# Patient Record
Sex: Female | Born: 1965 | Race: White | Hispanic: No | Smoking: Former smoker
Health system: Southern US, Community
[De-identification: ages and names within clinical notes are randomized; demographics above are authoritative.]

## PROBLEM LIST (undated history)

## (undated) DIAGNOSIS — F32A Depression, unspecified: Secondary | ICD-10-CM

## (undated) DIAGNOSIS — F329 Major depressive disorder, single episode, unspecified: Secondary | ICD-10-CM

## (undated) DIAGNOSIS — F419 Anxiety disorder, unspecified: Secondary | ICD-10-CM

## (undated) HISTORY — DX: Major depressive disorder, single episode, unspecified: F32.9

## (undated) HISTORY — DX: Depression, unspecified: F32.A

## (undated) HISTORY — DX: Anxiety disorder, unspecified: F41.9

## (undated) HISTORY — PX: AUGMENTATION MAMMAPLASTY: SUR837

---

## 1997-09-16 ENCOUNTER — Ambulatory Visit (HOSPITAL_BASED_OUTPATIENT_CLINIC_OR_DEPARTMENT_OTHER): Admission: RE | Admit: 1997-09-16 | Discharge: 1997-09-16 | Payer: Self-pay

## 1998-03-08 ENCOUNTER — Other Ambulatory Visit: Admission: RE | Admit: 1998-03-08 | Discharge: 1998-03-08 | Payer: Self-pay | Admitting: *Deleted

## 1999-05-08 ENCOUNTER — Other Ambulatory Visit: Admission: RE | Admit: 1999-05-08 | Discharge: 1999-05-08 | Payer: Self-pay | Admitting: *Deleted

## 2000-07-28 ENCOUNTER — Other Ambulatory Visit: Admission: RE | Admit: 2000-07-28 | Discharge: 2000-07-28 | Payer: Self-pay | Admitting: *Deleted

## 2001-12-17 ENCOUNTER — Other Ambulatory Visit: Admission: RE | Admit: 2001-12-17 | Discharge: 2001-12-17 | Payer: Self-pay | Admitting: Obstetrics and Gynecology

## 2003-05-11 ENCOUNTER — Other Ambulatory Visit: Admission: RE | Admit: 2003-05-11 | Discharge: 2003-05-11 | Payer: Self-pay | Admitting: Obstetrics and Gynecology

## 2004-05-30 ENCOUNTER — Other Ambulatory Visit: Admission: RE | Admit: 2004-05-30 | Discharge: 2004-05-30 | Payer: Self-pay | Admitting: Obstetrics and Gynecology

## 2004-06-18 ENCOUNTER — Encounter: Admission: RE | Admit: 2004-06-18 | Discharge: 2004-06-18 | Payer: Self-pay | Admitting: Obstetrics & Gynecology

## 2005-06-27 ENCOUNTER — Other Ambulatory Visit: Admission: RE | Admit: 2005-06-27 | Discharge: 2005-06-27 | Payer: Self-pay | Admitting: Obstetrics & Gynecology

## 2008-08-25 ENCOUNTER — Encounter: Admission: RE | Admit: 2008-08-25 | Discharge: 2008-08-25 | Payer: Self-pay | Admitting: Obstetrics & Gynecology

## 2010-04-15 ENCOUNTER — Encounter: Payer: Self-pay | Admitting: Obstetrics & Gynecology

## 2010-04-16 ENCOUNTER — Encounter: Payer: Self-pay | Admitting: Obstetrics & Gynecology

## 2010-06-04 ENCOUNTER — Ambulatory Visit (INDEPENDENT_AMBULATORY_CARE_PROVIDER_SITE_OTHER): Payer: Self-pay | Admitting: Sports Medicine

## 2010-06-04 ENCOUNTER — Encounter: Payer: Self-pay | Admitting: Sports Medicine

## 2010-06-04 DIAGNOSIS — M722 Plantar fascial fibromatosis: Secondary | ICD-10-CM

## 2010-06-04 DIAGNOSIS — M79609 Pain in unspecified limb: Secondary | ICD-10-CM | POA: Insufficient documentation

## 2010-06-12 NOTE — Assessment & Plan Note (Signed)
Summary: NP,RT FOOT/TOES SWELLING,MC   Vital Signs:  Patient profile:   45 year old female Height:      68 inches Weight:      130 pounds BMI:     19.84 BP sitting:   113 / 74  Vitals Entered By: Lillia Pauls CMA (June 04, 2010 9:38 AM)  CC:  R toe pain & L plantar.  History of Present Illness: [name] is a 44 yo who presents to clinic with a CC of R 3 & 4th toe pain and left plantar fasciitis.  The patient states that her pain began after a long walks on the beach barefoot over labor day weekend. In September she began experienced bilatetral PF symptoms and was seen by an orthopedic surgeon.  She began a stretching routine and wore some type of "pain pads" which helped her R plantar fasciitis resolve.  Unfortunately, the left side has not improved and she is still experiencing pain that is usually worse when she wakes up in the AM and has caused her to alter her gain.  After her PF started, the patient altered her gait which resulted in stubbing her right toes while ambulating. Ever since this event in September the patient has expereineced significant toe pain and swelling of her foot, especially the 3, 4, and 5th metatarsal. She was in a walking boot for 4 weeks at one time, however it did not help.  She received an XRay by her orthopedic surgeon & podiatrist yet neither showed signs of a fracture.   Physical Exam  General:  alert, healthy-appearing, and cooperative to examination.   Msk:  R foot no TTP at insertion of plantar fascia swollen 3, 4, and 5 metatarsals normal strength and ROM pes cavus  L foot TTP of insertion of plantar fascia no Achilles swelling or tenderness normal ROM and stregth pes cavus Additional Exam:  [place]: LPF 0.61 cm & calcification present, stone bruise located on calcaneus R: 4th MTP: effusion and interarticular fx with neovessels, 3rd MTP: interarticular fx with neovessels RPF 0.34 cm   Impression & Recommendations:  Problem # 1:   FASCIITIS, PLANTAR (ICD-728.71)  Left PF US showed 0.61 cm and calcifications presetn in LPF Cannot perform exercises due to bilateral foot injury Advised to massage plantar fascia 30 sec x 3 sevral times a day & ice bath Arch strap for support and return ASAP for custom fit orthotics (pes cavus)  Orders: [place] LIMITED ([id number])  Problem # 2:  FOOT PAIN (ICD-729.5) R interarticular fractures of 3rd and 4th MTP joint Advised to wear strap close to MTP joints while ambulating during the day to minimize movements Ice bath for R foot apprx 5-10 mins F/U appt ASAP for custom fit orthotics for pes cavus  Orders: Foot Orthosis ( Arch Strap/Heel Cup) [phone]) Sports Insoles [phone]) [place] LIMITED [phone])  Patient Instructions: 1)  Please make follow up appointment ASAP to be fitted for custom orthotics (please bring tennis shoes on this visit). 2)  Stretch plantar fascia and massage for 30 sec x 3 several times a day. 3)  Ice bath 5-10 mins for left plantar fasciitis and for swollen right toes. 4)  Wear support straps during the day and when on feet for long periods of time. Wear right strap closer to toes to prevent uneccessary movement. Wear left strap on arch for support.   Orders Added: 1)  Foot Orthosis ( Arch Strap/Heel Cup) [G9562] 2)  Sports Insoles [L3510] 3)  New Patient Level  III [id number] 4)  [place] LIMITED [[id number]]

## 2010-06-21 ENCOUNTER — Encounter: Payer: Self-pay | Admitting: Sports Medicine

## 2010-06-21 ENCOUNTER — Ambulatory Visit (INDEPENDENT_AMBULATORY_CARE_PROVIDER_SITE_OTHER): Payer: BC Managed Care – PPO | Admitting: Sports Medicine

## 2010-06-21 DIAGNOSIS — M722 Plantar fascial fibromatosis: Secondary | ICD-10-CM

## 2010-06-21 DIAGNOSIS — M79673 Pain in unspecified foot: Secondary | ICD-10-CM | POA: Insufficient documentation

## 2010-06-21 DIAGNOSIS — M79609 Pain in unspecified limb: Secondary | ICD-10-CM

## 2010-06-21 NOTE — Progress Notes (Signed)
  Subjective:    Patient ID: Lori Russell, female    DOB: 01-Apr-1965, 45 y.o.   MRN: [id number]  HPI 45 year old female to office today for custom orthotics. Patient has been suffering from bilateral plantar fasciitis for several months and has had no improvement with other conservative therapy. Symptoms are worse in the morning upon waking. She is doing home exercise program as directed. He is wearing arch wraps regularly. Also noted to have swelling in her third and fourth toes of the right foot, last visit [facility] ultrasound revealed signs of old fracture in this area which is likely contributing to her symptoms. Denies any numbness or tingling.   Review of Systems Per history of present illness BP 104/60     Objective:   Physical Exam GENERAL: Alert and oriented x3, no acute distress pleasant [facility]: - Foot/ankles: Ankles with full range of motion without pain, weakness, or instability.  Noted to have pes cavus bilaterally. She is tender to palpation along the insertion of plantar fascia bilaterally left greater than right. She has no Achilles tenderness or swelling bilaterally. Noted to have swollen 3rd & 4th toes on right foot with associated tenderness at the MTP and PIP joints, no surrounding bruising or erythema. - Gait: No leg length difference, walking with antalgic gait.       Assessment & Plan:   Problem List as of 06/21/2010        Rebecka Apley   Last Assessment & Plan Note   06/21/2010 Clinical Support Signed 06/21/2010  5:38 PM by Claris Che, MD    Fitted with custom orthotics in office today Patient was fitted for a : standard, cushioned, semi-rigid orthotic. The orthotic was heated, placed on the orthotic stand. The patient was positioned in subtalar neutral position and 10 degrees of ankle dorsiflexion in a weight bearing stance on the heated orthotic blank After completion of molding, a stable base was applied to the orthotic blank. The blank was ground  to a stable position for weight bearing. Size: 9  Base: Thin black orthotic blank for dress shoes Posting: blue EVA Additional posting: Neuroma pad placed on right orthotic Orthotics comfortable patient in the office today Should wear these in her work shoes, may remove in place in her walking shoes. - Should continue plantar fascia exercises - Continue to wear arch straps - Followup 6-8 weeks for reevaluation, may return sooner if needed       Pain in limb   Foot pain   Last Assessment & Plan Note   06/21/2010 Clinical Support Signed 06/21/2010  5:41 PM by Claris Che, MD    - R interarticular fractures of 3rd and 4th MTP joint noted on [facility] ultrasound last visit - Fitted with custom orthotics in office today with neuroma pad placed on right insert which should help with this - Continue to wear arch trap to minimize movement at this joint - Continue to ice as needed - Followup 6-8 weeks for reevaluation.

## 2010-06-21 NOTE — Assessment & Plan Note (Signed)
-   R interarticular fractures of 3rd and 4th MTP joint noted on [facility] ultrasound last visit - Fitted with custom orthotics in office today with neuroma pad placed on right insert which should help with this - Continue to wear arch trap to minimize movement at this joint - Continue to ice as needed - Followup 6-8 weeks for reevaluation.

## 2010-06-21 NOTE — Assessment & Plan Note (Signed)
Fitted with custom orthotics in office today Patient was fitted for a : standard, cushioned, semi-rigid orthotic. The orthotic was heated, placed on the orthotic stand. The patient was positioned in subtalar neutral position and 10 degrees of ankle dorsiflexion in a weight bearing stance on the heated orthotic blank After completion of molding, a stable base was applied to the orthotic blank. The blank was ground to a stable position for weight bearing. Size: 9  Base: Thin black orthotic blank for dress shoes Posting: blue EVA Additional posting: Neuroma pad placed on right orthotic Orthotics comfortable patient in the office today Should wear these in her work shoes, may remove in place in her walking shoes. - Should continue plantar fascia exercises - Continue to wear arch straps - Followup 6-8 weeks for reevaluation, may return sooner if needed

## 2011-08-08 ENCOUNTER — Encounter (HOSPITAL_COMMUNITY): Payer: Self-pay | Admitting: Licensed Clinical Social Worker

## 2011-08-08 ENCOUNTER — Ambulatory Visit (INDEPENDENT_AMBULATORY_CARE_PROVIDER_SITE_OTHER): Payer: BC Managed Care – PPO | Admitting: Licensed Clinical Social Worker

## 2011-08-08 DIAGNOSIS — F332 Major depressive disorder, recurrent severe without psychotic features: Secondary | ICD-10-CM

## 2011-08-08 DIAGNOSIS — F411 Generalized anxiety disorder: Secondary | ICD-10-CM

## 2011-08-08 NOTE — Progress Notes (Signed)
Patient ID: Lori Russell, female   DOB: 02/18/1966, 46 y.o.   MRN: [id number]  Patient:   Lori Russell   DOB:   [birth year]  MR Number:  [id number]  Location:  [facility] [address] [place] [address] Dept: [phone]           Date of Service:   08/08/2011  Start Time:   10:30am End Time:   11:30am  Provider/Observer:  Geanie Berlin LCSW       Billing Code/Service: [phone]  Chief Complaint:     Chief Complaint  Patient presents with  . Depression    weight loss 15 pounds,  increassed sleep   . Anxiety  . Stress    Reason for Service:  Patient is self referred for the treatment of depression.   Current Status:  Patient presents with depressed mood and anxious affect. She reports feeling severely depressed for the past 8 weeks and is concerned that she is not getting better. She endorses sadness all day, anhedonia, poor or absent appetite with weight loss of 15 pounds, increased sleep, obsessive thoughts and behaviors, increased isolation, poor ADL's on the weekends, crying spells, generalized anxiety and feelings of dread. She has a history of exercising compulsively but abruptly stopped when she became depressed. She has a vitamin ritual of taking 35 vitamins a day and endorses obsessive thoughts about her health. She denies any checking or counting behaviors. 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 She does have some problems with focus at work. She finds parenting stressful and is concerned that her daughter will be coming home from college this summer and she will have to interact with her. She is tearful and guarded during this assessment and often chooses to avoid answering questions involving detail. She does report that she ended a long term relationship with her boyfriend of 10 years in January, but will not disclose any  further information about this. Her thinking is clear and goal directed. She drinks two to three glasses of wine each night, which she reports is normal for her. She does not think she is drinking too much. She denies any AH, VH or paranoia. She denies any suicidal or homicida ideation, intent or plan.   Reliability of Information: Very good  Behavioral Observation: Lori Russell  presents as a 46 y.o.-year-old  Caucasian Female who appeared her stated age. her dress was Appropriate and she was Well Groomed and her manners were Appropriate to the situation.  There were not any physical disabilities noted.  she displayed an appropriate level of cooperation and motivation.    Interactions:    Active   Attention:   within normal limits  Memory:   within normal limits  Visuo-spatial:   within normal limits  Speech (Volume):  normal  Speech:   normal pitch and normal volume  Thought Process:  Coherent and Relevant  Though Content:  WNL  Orientation:   person, place, time/date and situation  Judgment:   Good  Planning:   Good  Affect:    Anxious and Depressed  Mood:    Anxious and Depressed  Insight:   Fair  Intelligence:   normal  Marital Status/Living: Divorced for 12 years. Has two children. One daughter is away at college and her son lives with her. Her daughter will be returning   Current Employment: Works as  a Equities trader.   Past Employment:    Substance Use:  There are suspicions of alcohol abuse reported by the patient.    Education:   College  Medical History:   Past Medical History  Diagnosis Date  . Depression   . Asthma         No outpatient encounter prescriptions on file as of 08/08/2011.          Sexual History:   History  Sexual Activity  . Sexually Active: Not Currently    Abuse/Trauma History: None reported   Psychiatric History:  Past outpatient treatment for therapy. Had a trial of Paxil years ago. Has has at least two other  major depressive episodes. Typically does not follow through on therapy because she is too guarded. There are no o inpatient hospitalizations or suicide attempts.    Family Med/Psych History: History reviewed. No pertinent family history.  Risk of Suicide/Violence: virtually non-existent   Impression/DX:  MDD severe, recurrent without psychotic features; generalized anxiety disorder   Disposition/Plan:  Weekly treatment to target depression and anxiety. Recommend patient see Dr. Donell Beers for medication evaluation. She agrees to this plan of treatment.   Diagnosis:    Axis I:  MDD severe, recurrent without psychotic features; generalized anxiety disorder        Axis II: No diagnosis       Axis III:  none      Axis IV:  problems related to social environment and problems with primary support group          Axis V:  51-60 moderate symptoms

## 2011-08-26 ENCOUNTER — Encounter (HOSPITAL_COMMUNITY): Payer: Self-pay | Admitting: Licensed Clinical Social Worker

## 2011-08-26 ENCOUNTER — Ambulatory Visit (HOSPITAL_COMMUNITY): Payer: Self-pay | Admitting: Licensed Clinical Social Worker

## 2011-08-26 NOTE — Progress Notes (Signed)
Patient ID: Lori Russell, female   DOB: 10/13/1968, 46 y.o.   MRN: [id number] Patient was a no show/no call for appointment. Letter sent.

## 2011-09-02 ENCOUNTER — Ambulatory Visit (HOSPITAL_COMMUNITY): Payer: Self-pay | Admitting: Licensed Clinical Social Worker

## 2011-09-02 ENCOUNTER — Encounter (HOSPITAL_COMMUNITY): Payer: Self-pay | Admitting: Licensed Clinical Social Worker

## 2011-09-02 NOTE — Progress Notes (Signed)
Patient ID: Lori Russell, female   DOB: 10/13/1968, 46 y.o.   MRN: [id number] Patient was a no show/no call for appointment today.

## 2011-09-09 ENCOUNTER — Ambulatory Visit (HOSPITAL_COMMUNITY): Payer: Self-pay | Admitting: Licensed Clinical Social Worker

## 2011-09-09 ENCOUNTER — Encounter (HOSPITAL_COMMUNITY): Payer: Self-pay

## 2011-09-09 ENCOUNTER — Telehealth (HOSPITAL_COMMUNITY): Payer: Self-pay

## 2011-09-09 NOTE — Telephone Encounter (Signed)
1:39pm 09/09/11 called pt due to email from provider - th ept has missed her last several appts - unable to leave msg due to mailbox is full./sh  Sending letter./sh

## 2011-09-16 ENCOUNTER — Ambulatory Visit (HOSPITAL_COMMUNITY): Payer: Self-pay | Admitting: Licensed Clinical Social Worker

## 2011-09-18 ENCOUNTER — Ambulatory Visit (HOSPITAL_COMMUNITY): Payer: Self-pay | Admitting: Psychiatry

## 2018-12-28 ENCOUNTER — Other Ambulatory Visit: Payer: Self-pay

## 2018-12-28 DIAGNOSIS — Z20822 Contact with and (suspected) exposure to covid-19: Secondary | ICD-10-CM

## 2018-12-30 LAB — NOVEL CORONAVIRUS, NAA: SARS-CoV-2, NAA: NOT DETECTED

## 2019-05-17 ENCOUNTER — Other Ambulatory Visit: Payer: Self-pay | Admitting: Physician Assistant

## 2019-05-17 ENCOUNTER — Other Ambulatory Visit (HOSPITAL_COMMUNITY)
Admission: RE | Admit: 2019-05-17 | Discharge: 2019-05-17 | Disposition: A | Discharge status: Home / Self Care | Payer: 59 | Source: Ambulatory Visit | Attending: Physician Assistant | Admitting: Physician Assistant

## 2019-05-17 DIAGNOSIS — Z124 Encounter for screening for malignant neoplasm of cervix: Secondary | ICD-10-CM | POA: Diagnosis not present

## 2019-05-17 DIAGNOSIS — Z0000 Encounter for general adult medical examination without abnormal findings: Secondary | ICD-10-CM | POA: Diagnosis not present

## 2019-05-17 DIAGNOSIS — Z1211 Encounter for screening for malignant neoplasm of colon: Secondary | ICD-10-CM | POA: Diagnosis not present

## 2019-05-17 DIAGNOSIS — Z23 Encounter for immunization: Secondary | ICD-10-CM | POA: Diagnosis not present

## 2019-05-17 DIAGNOSIS — E785 Hyperlipidemia, unspecified: Secondary | ICD-10-CM | POA: Diagnosis not present

## 2019-05-18 DIAGNOSIS — D485 Neoplasm of uncertain behavior of skin: Secondary | ICD-10-CM | POA: Diagnosis not present

## 2019-05-18 LAB — CYTOLOGY - PAP
Comment: NEGATIVE
Diagnosis: NEGATIVE
High risk HPV: NEGATIVE

## 2019-05-19 ENCOUNTER — Other Ambulatory Visit: Payer: Self-pay | Admitting: Physician Assistant

## 2019-05-19 DIAGNOSIS — Z1231 Encounter for screening mammogram for malignant neoplasm of breast: Secondary | ICD-10-CM

## 2019-05-25 DIAGNOSIS — Z23 Encounter for immunization: Secondary | ICD-10-CM | POA: Diagnosis not present

## 2019-05-25 DIAGNOSIS — L089 Local infection of the skin and subcutaneous tissue, unspecified: Secondary | ICD-10-CM | POA: Diagnosis not present

## 2019-05-25 DIAGNOSIS — Z5189 Encounter for other specified aftercare: Secondary | ICD-10-CM | POA: Diagnosis not present

## 2019-05-31 DIAGNOSIS — Z23 Encounter for immunization: Secondary | ICD-10-CM | POA: Diagnosis not present

## 2019-06-24 ENCOUNTER — Other Ambulatory Visit: Payer: Self-pay

## 2019-06-24 ENCOUNTER — Other Ambulatory Visit: Payer: Self-pay | Admitting: Physician Assistant

## 2019-06-24 ENCOUNTER — Ambulatory Visit
Admission: RE | Admit: 2019-06-24 | Discharge: 2019-06-24 | Disposition: A | Discharge status: Home / Self Care | Payer: 59 | Source: Ambulatory Visit | Attending: Physician Assistant | Admitting: Physician Assistant

## 2019-06-24 DIAGNOSIS — Z1231 Encounter for screening mammogram for malignant neoplasm of breast: Secondary | ICD-10-CM

## 2019-06-28 DIAGNOSIS — Z23 Encounter for immunization: Secondary | ICD-10-CM | POA: Diagnosis not present

## 2019-07-28 DIAGNOSIS — Z23 Encounter for immunization: Secondary | ICD-10-CM | POA: Diagnosis not present

## 2019-08-11 DIAGNOSIS — Z1159 Encounter for screening for other viral diseases: Secondary | ICD-10-CM | POA: Diagnosis not present

## 2019-08-16 DIAGNOSIS — D123 Benign neoplasm of transverse colon: Secondary | ICD-10-CM | POA: Diagnosis not present

## 2019-08-16 DIAGNOSIS — Z1211 Encounter for screening for malignant neoplasm of colon: Secondary | ICD-10-CM | POA: Diagnosis not present

## 2019-08-18 DIAGNOSIS — D123 Benign neoplasm of transverse colon: Secondary | ICD-10-CM | POA: Diagnosis not present

## 2020-05-18 DIAGNOSIS — E2839 Other primary ovarian failure: Secondary | ICD-10-CM | POA: Diagnosis not present

## 2020-05-18 DIAGNOSIS — E785 Hyperlipidemia, unspecified: Secondary | ICD-10-CM | POA: Diagnosis not present

## 2020-05-18 DIAGNOSIS — M545 Low back pain, unspecified: Secondary | ICD-10-CM | POA: Diagnosis not present

## 2020-05-18 DIAGNOSIS — H60501 Unspecified acute noninfective otitis externa, right ear: Secondary | ICD-10-CM | POA: Diagnosis not present

## 2020-05-18 DIAGNOSIS — Z0000 Encounter for general adult medical examination without abnormal findings: Secondary | ICD-10-CM | POA: Diagnosis not present

## 2020-05-18 DIAGNOSIS — G8929 Other chronic pain: Secondary | ICD-10-CM | POA: Diagnosis not present

## 2020-05-18 DIAGNOSIS — H539 Unspecified visual disturbance: Secondary | ICD-10-CM | POA: Diagnosis not present

## 2020-05-19 ENCOUNTER — Other Ambulatory Visit: Payer: Self-pay | Admitting: Physician Assistant

## 2020-05-19 DIAGNOSIS — E2839 Other primary ovarian failure: Secondary | ICD-10-CM

## 2020-05-31 DIAGNOSIS — N951 Menopausal and female climacteric states: Secondary | ICD-10-CM | POA: Diagnosis not present

## 2020-12-06 DIAGNOSIS — Q828 Other specified congenital malformations of skin: Secondary | ICD-10-CM | POA: Diagnosis not present

## 2020-12-06 DIAGNOSIS — Z411 Encounter for cosmetic surgery: Secondary | ICD-10-CM | POA: Diagnosis not present

## 2020-12-06 DIAGNOSIS — D2261 Melanocytic nevi of right upper limb, including shoulder: Secondary | ICD-10-CM | POA: Diagnosis not present

## 2020-12-06 DIAGNOSIS — D225 Melanocytic nevi of trunk: Secondary | ICD-10-CM | POA: Diagnosis not present

## 2020-12-06 DIAGNOSIS — L578 Other skin changes due to chronic exposure to nonionizing radiation: Secondary | ICD-10-CM | POA: Diagnosis not present

## 2020-12-06 DIAGNOSIS — L821 Other seborrheic keratosis: Secondary | ICD-10-CM | POA: Diagnosis not present

## 2020-12-06 DIAGNOSIS — Z86018 Personal history of other benign neoplasm: Secondary | ICD-10-CM | POA: Diagnosis not present

## 2020-12-06 DIAGNOSIS — L720 Epidermal cyst: Secondary | ICD-10-CM | POA: Diagnosis not present

## 2021-03-13 IMAGING — MG DIGITAL SCREENING BREAST BILAT IMPLANT W/ TOMO W/ CAD
8 of 17 series · 8 of 40 positions shown · non-contrast
Comparison: Previous exam(s).

CLINICAL DATA: Screening.

EXAM:
DIGITAL SCREENING BILATERAL MAMMOGRAM WITH IMPLANTS, CAD AND TOMO
The patient has retropectoral implants. Standard and implant
displaced views were performed.

[L MLO]
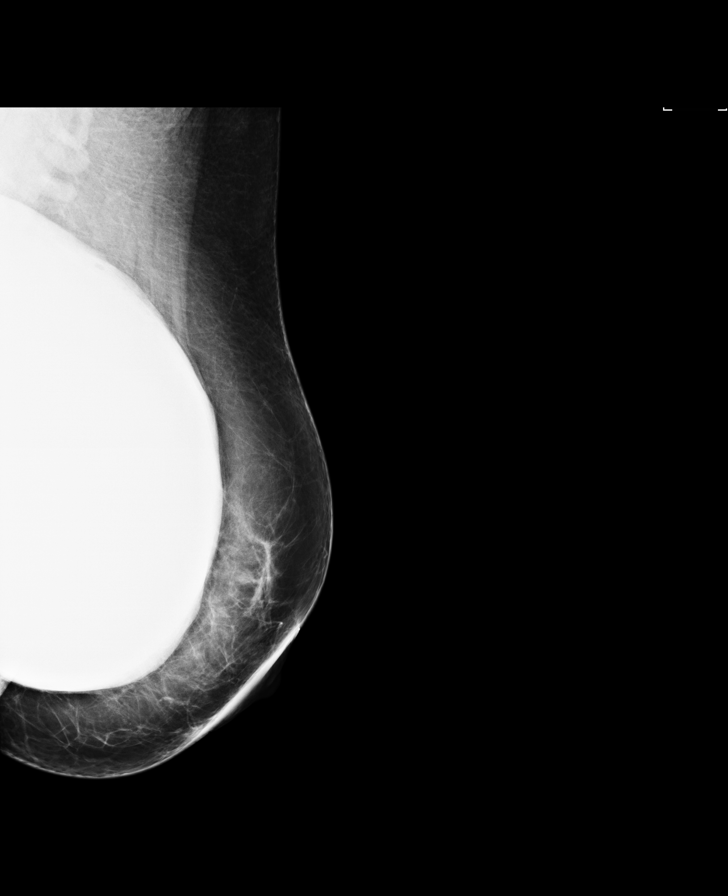

[L CC]
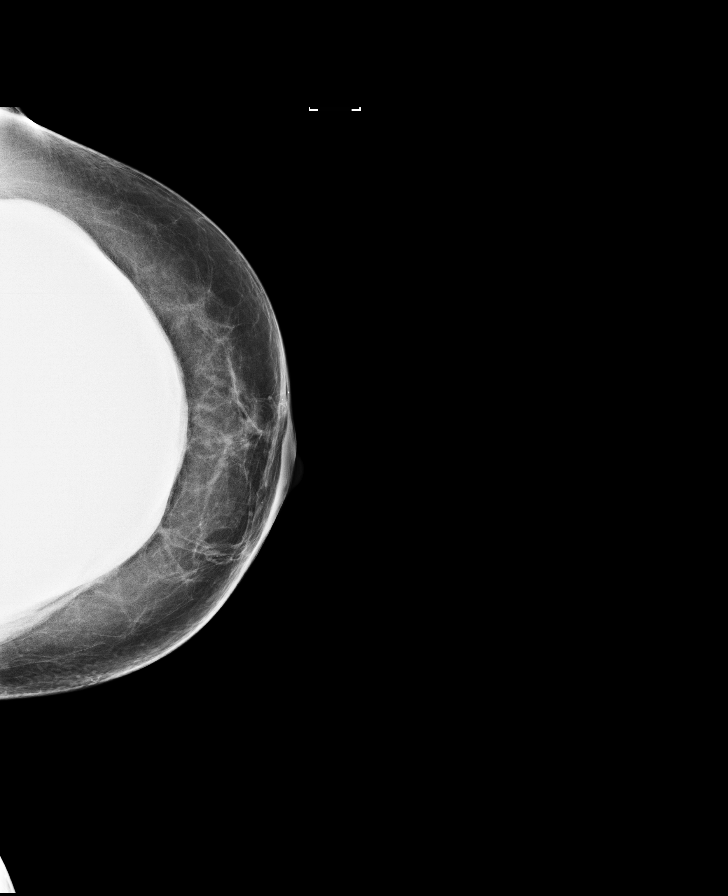

[R CC]
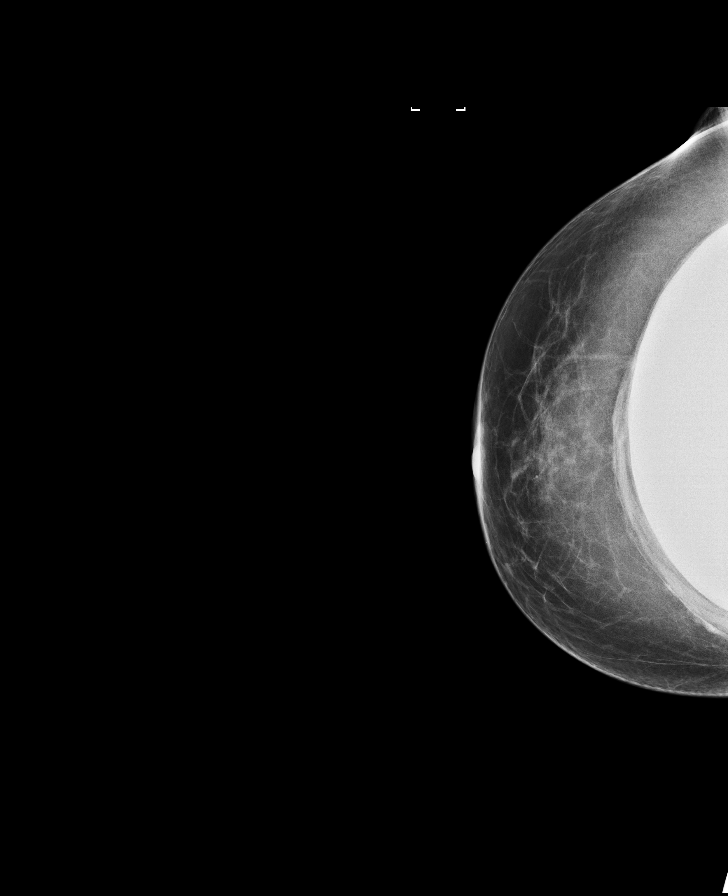

[R MLO]
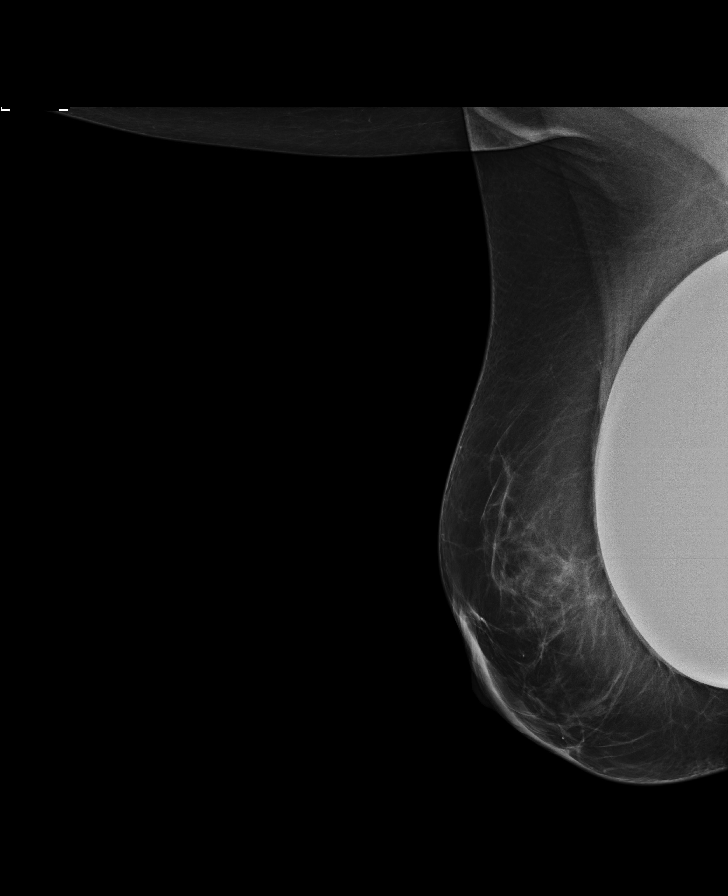

[L MLO synth-2D (1 of 2)]
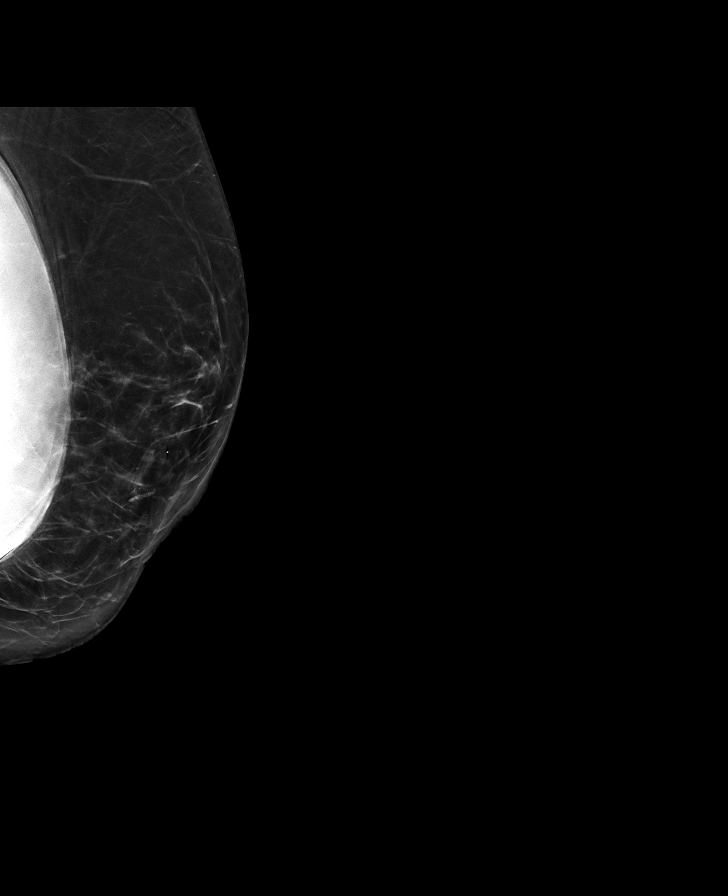

[R CC synth-2D (1 of 2)]
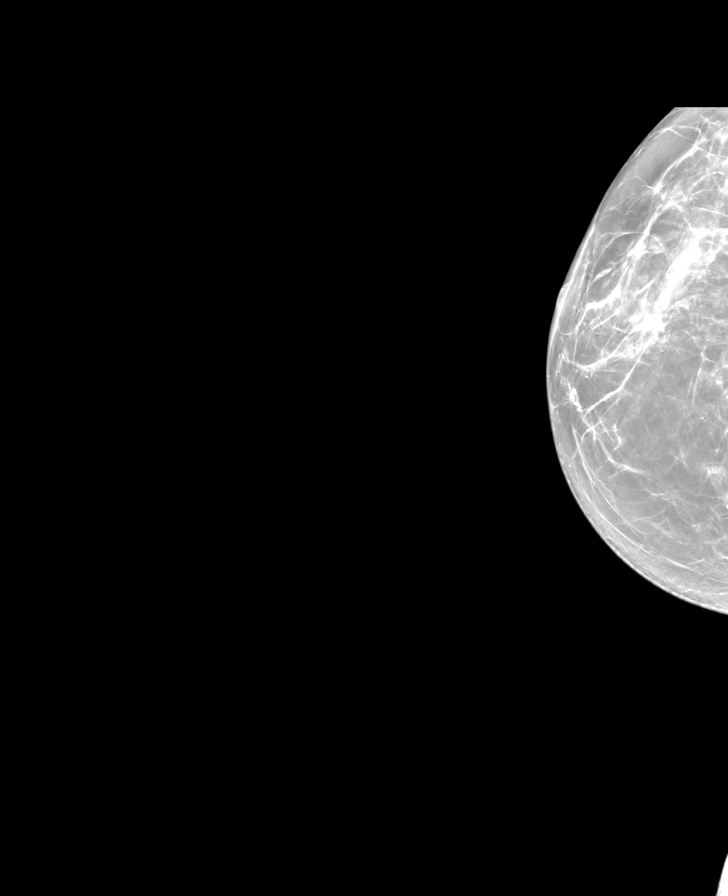

[R CC synth-2D (2 of 2)]
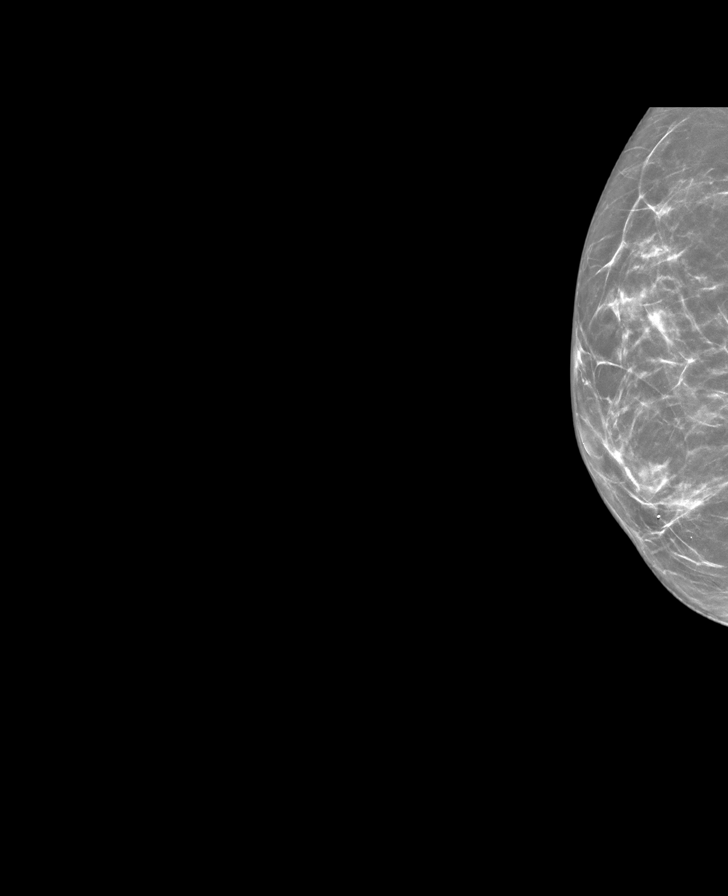

[L MLO synth-2D (2 of 2)]
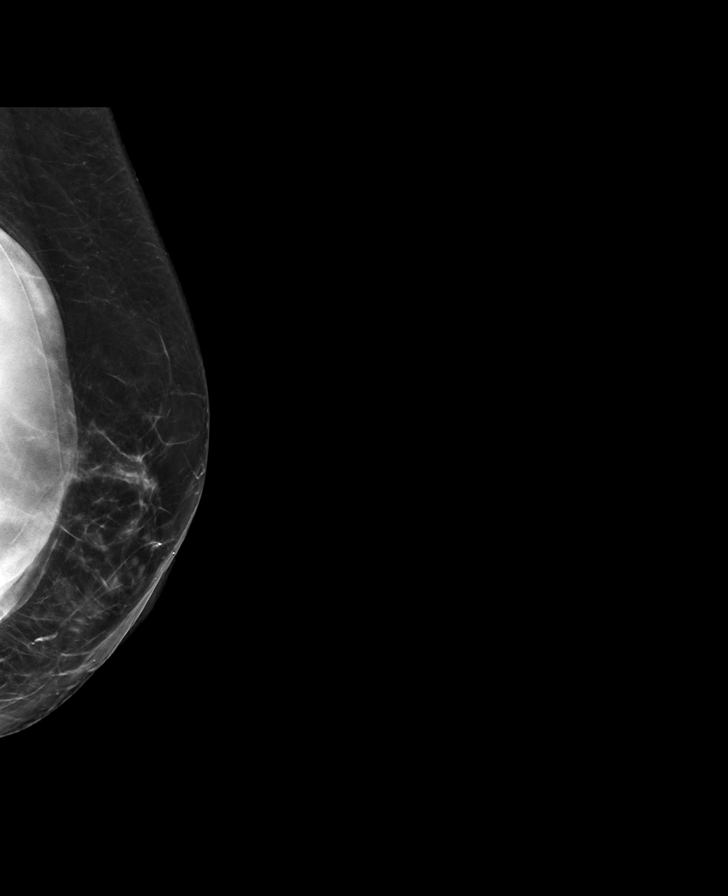

[8 of 40 positions shown; findings below may reference images not displayed]

ACR Breast Density Category b: There are scattered areas of
fibroglandular density.
FINDINGS: There are no findings suspicious for malignancy. Images were
processed with CAD.
IMPRESSION: No mammographic evidence of malignancy. A result letter of this
screening mammogram will be mailed directly to the patient.

RECOMMENDATION:
Screening mammogram in one year. (Code:60-T-8Z4)

BI-RADS CATEGORY  1:  Negative.

## 2021-06-14 DIAGNOSIS — L03114 Cellulitis of left upper limb: Secondary | ICD-10-CM | POA: Diagnosis not present

## 2021-06-14 DIAGNOSIS — W57XXXA Bitten or stung by nonvenomous insect and other nonvenomous arthropods, initial encounter: Secondary | ICD-10-CM | POA: Diagnosis not present

## 2021-06-14 DIAGNOSIS — S50862A Insect bite (nonvenomous) of left forearm, initial encounter: Secondary | ICD-10-CM | POA: Diagnosis not present

## 2021-11-26 ENCOUNTER — Emergency Department (HOSPITAL_BASED_OUTPATIENT_CLINIC_OR_DEPARTMENT_OTHER)
Admission: EM | Admit: 2021-11-26 | Discharge: 2021-11-26 | Disposition: A | Discharge status: Home / Self Care | Payer: 59 | Attending: Emergency Medicine | Admitting: Emergency Medicine

## 2021-11-26 ENCOUNTER — Other Ambulatory Visit: Payer: Self-pay

## 2021-11-26 ENCOUNTER — Encounter (HOSPITAL_BASED_OUTPATIENT_CLINIC_OR_DEPARTMENT_OTHER): Payer: Self-pay

## 2021-11-26 ENCOUNTER — Emergency Department (HOSPITAL_BASED_OUTPATIENT_CLINIC_OR_DEPARTMENT_OTHER): Payer: 59

## 2021-11-26 DIAGNOSIS — R109 Unspecified abdominal pain: Secondary | ICD-10-CM | POA: Insufficient documentation

## 2021-11-26 DIAGNOSIS — K760 Fatty (change of) liver, not elsewhere classified: Secondary | ICD-10-CM | POA: Diagnosis not present

## 2021-11-26 DIAGNOSIS — M79622 Pain in left upper arm: Secondary | ICD-10-CM | POA: Diagnosis not present

## 2021-11-26 DIAGNOSIS — R079 Chest pain, unspecified: Secondary | ICD-10-CM | POA: Diagnosis not present

## 2021-11-26 MED ORDER — LIDOCAINE 5 % EX PTCH
1.0000 | MEDICATED_PATCH | CUTANEOUS | Status: DC
  Administered 2021-11-26: 1 via TRANSDERMAL
  Filled 2021-11-26: qty 1

## 2021-11-26 MED ORDER — CYCLOBENZAPRINE HCL 10 MG PO TABS: 10.0000 mg | ORAL_TABLET | Freq: Two times a day (BID) | ORAL | 0 refills | Status: DC | PRN

## 2021-11-26 MED ORDER — CYCLOBENZAPRINE HCL 10 MG PO TABS
10.0000 mg | ORAL_TABLET | Freq: Once | ORAL | Status: AC
  Administered 2021-11-26: 10 mg via ORAL
  Filled 2021-11-26: qty 1

## 2021-11-26 MED ORDER — LIDOCAINE 5 % EX PTCH: 1.0000 | MEDICATED_PATCH | CUTANEOUS | 0 refills | Status: AC

## 2021-11-26 MED ORDER — METHYLPREDNISOLONE 4 MG PO TBPK: ORAL_TABLET | ORAL | 0 refills | Status: DC

## 2021-11-26 NOTE — ED Triage Notes (Signed)
Pt presents POV from home, seen at an UC in [place]   Pt c/o Left side abd pain x2 weeks. Pt reports it has been a dull gradual onset that has gotten progressively worse. Pt endorses excruciating pain starting this am.  UC reports blood in her urine, suggested she have a CT to r/o kidney stones. Pt denies hx of kidney stones

## 2021-11-26 NOTE — ED Provider Notes (Signed)
[facility] EMERGENCY DEPT Provider Note   CSN: [id number] Arrival date & time: 11/26/21  1335     History  Chief Complaint  Patient presents with   Abdominal Pain    Lori Russell is a 56 y.o. female.  Patient here with left-sided rib pain/flank pain for the last 3 to 4 weeks.  History of kidney stones.  No other history of medical issues.  Denies any specific trauma or pain with urination or falls.  Hurts when she twists or turns or bends over.  Denies any recent travel or surgery.  No chest pain or shortness of breath or weakness or numbness or tingling.  The history is provided by the patient.       Home Medications Prior to Admission medications   Medication Sig Start Date End Date Taking? Authorizing Provider  cyclobenzaprine (FLEXERIL) 10 MG tablet Take 1 tablet (10 mg total) by mouth 2 (two) times daily as needed for muscle spasms. 11/26/21  Yes Nansi Birmingham, DO  lidocaine (LIDODERM) 5 % Place 1 patch onto the skin daily for 15 doses. Remove & Discard patch within 12 hours or as directed by MD 11/26/21 12/11/21 Yes Lockie Mola, Letha Mirabal, DO  methylPREDNISolone (MEDROL DOSEPAK) 4 MG TBPK tablet Follow package insert 11/26/21  Yes Pelham Hennick, DO      Allergies    Patient has no known allergies.    Review of Systems   Review of Systems  Physical Exam Updated Vital Signs BP (!) 167/89 (BP Location: Left Arm)   Pulse 78   Temp 98.4 F (36.9 C)   Resp 18   SpO2 99%  Physical Exam Vitals and nursing note reviewed.  Constitutional:      General: She is not in acute distress.    Appearance: She is well-developed.  HENT:     Head: Normocephalic and atraumatic.  Eyes:     Extraocular Movements: Extraocular movements intact.     Conjunctiva/sclera: Conjunctivae normal.  Cardiovascular:     Rate and Rhythm: Normal rate and regular rhythm.     Heart sounds: Normal heart sounds. No murmur heard. Pulmonary:     Effort: Pulmonary effort is normal. No  respiratory distress.     Breath sounds: Normal breath sounds.  Abdominal:     General: Abdomen is flat.     Palpations: Abdomen is soft.     Tenderness: There is no abdominal tenderness. There is left CVA tenderness.  Musculoskeletal:        General: No swelling.     Cervical back: Neck supple.  Skin:    General: Skin is warm and dry.     Capillary Refill: Capillary refill takes less than 2 seconds.  Neurological:     Mental Status: She is alert.  Psychiatric:        Mood and Affect: Mood normal.     ED Results / Procedures / Treatments   Labs (all labs ordered are listed, but only abnormal results are displayed) Labs Reviewed - No data to display  EKG None  Radiology CT Renal Stone Study  Result Date: 11/26/2021 CLINICAL DATA:  Worsening left-sided abdominal and flank pain for 2 weeks. EXAM: CT ABDOMEN AND PELVIS WITHOUT CONTRAST TECHNIQUE: Multidetector CT imaging of the abdomen and pelvis was performed following the standard protocol without IV contrast. RADIATION DOSE REDUCTION: This exam was performed according to the departmental dose-optimization program which includes automated exposure control, adjustment of the mA and/or kV according to patient size and/or use of  iterative reconstruction technique. COMPARISON:  None Available. FINDINGS: Lower chest: No acute findings. Hepatobiliary: Mild diffuse hepatic steatosis is seen. An irregular low-attenuation lesion is seen in segment 2 of the left hepatic lobe which cannot be characterized on this unenhanced exam. This could represent hepatic mass or focal fatty infiltration. High attenuation in the gallbladder lumen could represent sludge or tiny calculi. No evidence of cholecystitis or biliary ductal dilatation. Pancreas: No mass or inflammatory process visualized on this unenhanced exam. Spleen:  Within normal limits in size. Adrenals/Urinary tract: No evidence of urolithiasis or hydronephrosis. Unremarkable unopacified urinary  bladder. Stomach/Bowel: No evidence of obstruction, inflammatory process, or abnormal fluid collections. Normal appendix visualized. Vascular/Lymphatic: No pathologically enlarged lymph nodes identified. No evidence of abdominal aortic aneurysm. Reproductive: 1.4 cm left anterior uterine fibroid noted. Adnexal regions are unremarkable. Other:  None. Musculoskeletal:  No suspicious bone lesions identified. IMPRESSION: No evidence of urolithiasis, hydronephrosis, or other acute findings. Low-attenuation lesion in left hepatic lobe cannot be characterized on this unenhanced exam. Differential diagnosis includes hepatic mass and focal fatty infiltration. Nonemergent outpatient abdomen MRI without and with contrast is recommended for further characterization. 1.4 cm left anterior uterine fibroid. Electronically Signed   By: Danae Orleans M.D.   On: 11/26/2021 14:36    Procedures Procedures    Medications Ordered in ED Medications  lidocaine (LIDODERM) 5 % 1 patch (1 patch Transdermal Patch Applied 11/26/21 1556)  cyclobenzaprine (FLEXERIL) tablet 10 mg (10 mg Oral Given 11/26/21 1556)    ED Course/ Medical Decision Making/ A&P                           Medical Decision Making Amount and/or Complexity of Data Reviewed Radiology: ordered.  Risk Prescription drug management.   Toma T Hiser is here with left flank plain.  Differential diagnosis kidney stone versus musculoskeletal process/costochondritis, UTI.  Had a urinalysis done at urgent care today that showed no evidence of infection.  She has a history of kidney stones.  Unremarkable vitals.  No fever.  Reproducible pain on exam.  CT scan was ordered that showed no evidence of kidney stone or other acute findings.  She was made aware of liver lesion incidentally and recommended outpatient MRI with her primary care doctor.  Overall I think that this is muscular.  I have no concern for ACS or PE or other infectious process.  Lower lungs on the CT  scan that showed no pneumonia.  We will put her on Medrol Dosepak, lidocaine patches, Flexeril and have her follow-up with sports medicine.  Patient discharged in good condition.  This chart was dictated using voice recognition software.  Despite best efforts to proofread,  errors can occur which can change the documentation meaning.         Final Clinical Impression(s) / ED Diagnoses Final diagnoses:  Flank pain    Rx / DC Orders ED Discharge Orders          Ordered    lidocaine (LIDODERM) 5 %  Every 24 hours        11/26/21 1546    methylPREDNISolone (MEDROL DOSEPAK) 4 MG TBPK tablet        11/26/21 1546    cyclobenzaprine (FLEXERIL) 10 MG tablet  2 times daily PRN        11/26/21 1546              Romeo Zielinski, DO 11/26/21 1557

## 2021-11-26 NOTE — Discharge Instructions (Signed)
Overall suspect you have a muscular issue.  Use lidocaine patches as prescribed.  Take Medrol Dosepak as prescribed, hold on any ibuprofen or Motrin or Aleve while taking this medicine.  Recommend 1000 mg of Tylenol every 6 hours as needed for pain as well.  I have also prescribed you a muscle relaxant called Flexeril.  Please be careful with this medicine as it is sedating so do not use while doing dangerous activities including driving.  Follow-up with sports medicine.

## 2022-06-20 DIAGNOSIS — L570 Actinic keratosis: Secondary | ICD-10-CM | POA: Diagnosis not present

## 2022-06-20 DIAGNOSIS — L720 Epidermal cyst: Secondary | ICD-10-CM | POA: Diagnosis not present

## 2022-06-20 DIAGNOSIS — D2261 Melanocytic nevi of right upper limb, including shoulder: Secondary | ICD-10-CM | POA: Diagnosis not present

## 2022-06-20 DIAGNOSIS — Z86018 Personal history of other benign neoplasm: Secondary | ICD-10-CM | POA: Diagnosis not present

## 2022-06-20 DIAGNOSIS — Q828 Other specified congenital malformations of skin: Secondary | ICD-10-CM | POA: Diagnosis not present

## 2022-06-20 DIAGNOSIS — D225 Melanocytic nevi of trunk: Secondary | ICD-10-CM | POA: Diagnosis not present

## 2022-06-20 DIAGNOSIS — L821 Other seborrheic keratosis: Secondary | ICD-10-CM | POA: Diagnosis not present

## 2022-06-20 DIAGNOSIS — Z411 Encounter for cosmetic surgery: Secondary | ICD-10-CM | POA: Diagnosis not present

## 2022-06-20 DIAGNOSIS — B360 Pityriasis versicolor: Secondary | ICD-10-CM | POA: Diagnosis not present

## 2022-06-20 DIAGNOSIS — L578 Other skin changes due to chronic exposure to nonionizing radiation: Secondary | ICD-10-CM | POA: Diagnosis not present

## 2022-10-30 ENCOUNTER — Emergency Department (HOSPITAL_BASED_OUTPATIENT_CLINIC_OR_DEPARTMENT_OTHER): Payer: 59

## 2022-10-30 ENCOUNTER — Emergency Department (HOSPITAL_BASED_OUTPATIENT_CLINIC_OR_DEPARTMENT_OTHER)
Admission: EM | Admit: 2022-10-30 | Discharge: 2022-10-30 | Disposition: A | Discharge status: Home / Self Care | Payer: 59 | Attending: Emergency Medicine | Admitting: Emergency Medicine

## 2022-10-30 ENCOUNTER — Other Ambulatory Visit: Payer: Self-pay

## 2022-10-30 ENCOUNTER — Encounter (HOSPITAL_BASED_OUTPATIENT_CLINIC_OR_DEPARTMENT_OTHER): Payer: Self-pay | Admitting: Emergency Medicine

## 2022-10-30 DIAGNOSIS — H1132 Conjunctival hemorrhage, left eye: Secondary | ICD-10-CM | POA: Insufficient documentation

## 2022-10-30 DIAGNOSIS — S0012XA Contusion of left eyelid and periocular area, initial encounter: Secondary | ICD-10-CM | POA: Diagnosis not present

## 2022-10-30 DIAGNOSIS — S0592XA Unspecified injury of left eye and orbit, initial encounter: Secondary | ICD-10-CM | POA: Diagnosis present

## 2022-10-30 DIAGNOSIS — I10 Essential (primary) hypertension: Secondary | ICD-10-CM | POA: Insufficient documentation

## 2022-10-30 DIAGNOSIS — W228XXA Striking against or struck by other objects, initial encounter: Secondary | ICD-10-CM | POA: Diagnosis not present

## 2022-10-30 DIAGNOSIS — X58XXXA Exposure to other specified factors, initial encounter: Secondary | ICD-10-CM | POA: Diagnosis not present

## 2022-10-30 DIAGNOSIS — S0590XA Unspecified injury of unspecified eye and orbit, initial encounter: Secondary | ICD-10-CM

## 2022-10-30 DIAGNOSIS — J329 Chronic sinusitis, unspecified: Secondary | ICD-10-CM | POA: Diagnosis not present

## 2022-10-30 DIAGNOSIS — S0512XA Contusion of eyeball and orbital tissues, left eye, initial encounter: Secondary | ICD-10-CM | POA: Diagnosis not present

## 2022-10-30 MED ORDER — AMLODIPINE BESYLATE 5 MG PO TABS
5.0000 mg | ORAL_TABLET | Freq: Once | ORAL | Status: AC
  Administered 2022-10-30: 5 mg via ORAL
  Filled 2022-10-30: qty 1

## 2022-10-30 NOTE — ED Notes (Signed)
Pt verbalized understanding of d/c instructions, meds, and followup care. Denies questions. VSS, no distress noted. Steady gait to exit with all belongings. Held for reactions due to new med- none observed.

## 2022-10-30 NOTE — ED Triage Notes (Signed)
Pt via pov from UC for a ct after a cork came out of a prosecco bottle and hit her in the eye. UC ruled out eye issue, but want a ct to rule out fracture. Pt alert & oriented, nad noted.
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
## 2022-10-31 ENCOUNTER — Encounter (HOSPITAL_BASED_OUTPATIENT_CLINIC_OR_DEPARTMENT_OTHER): Payer: Self-pay | Admitting: Family Medicine

## 2022-10-31 ENCOUNTER — Ambulatory Visit (HOSPITAL_BASED_OUTPATIENT_CLINIC_OR_DEPARTMENT_OTHER): Payer: 59 | Admitting: Family Medicine

## 2022-10-31 VITALS — BP 158/98 | HR 64 | Ht 67.5 in | Wt 145.5 lb

## 2022-10-31 DIAGNOSIS — K769 Liver disease, unspecified: Secondary | ICD-10-CM | POA: Diagnosis not present

## 2022-10-31 DIAGNOSIS — Z7689 Persons encountering health services in other specified circumstances: Secondary | ICD-10-CM

## 2022-10-31 DIAGNOSIS — E7849 Other hyperlipidemia: Secondary | ICD-10-CM

## 2022-10-31 DIAGNOSIS — E2839 Other primary ovarian failure: Secondary | ICD-10-CM | POA: Insufficient documentation

## 2022-10-31 DIAGNOSIS — I10 Essential (primary) hypertension: Secondary | ICD-10-CM

## 2022-10-31 DIAGNOSIS — Z8659 Personal history of other mental and behavioral disorders: Secondary | ICD-10-CM | POA: Insufficient documentation

## 2022-10-31 DIAGNOSIS — Z1231 Encounter for screening mammogram for malignant neoplasm of breast: Secondary | ICD-10-CM

## 2022-10-31 DIAGNOSIS — S0592XD Unspecified injury of left eye and orbit, subsequent encounter: Secondary | ICD-10-CM

## 2022-10-31 DIAGNOSIS — G8929 Other chronic pain: Secondary | ICD-10-CM

## 2022-10-31 DIAGNOSIS — H4322 Crystalline deposits in vitreous body, left eye: Secondary | ICD-10-CM | POA: Diagnosis not present

## 2022-10-31 DIAGNOSIS — S058X2A Other injuries of left eye and orbit, initial encounter: Secondary | ICD-10-CM | POA: Diagnosis not present

## 2022-10-31 DIAGNOSIS — E785 Hyperlipidemia, unspecified: Secondary | ICD-10-CM | POA: Insufficient documentation

## 2022-10-31 DIAGNOSIS — N951 Menopausal and female climacteric states: Secondary | ICD-10-CM | POA: Insufficient documentation

## 2022-10-31 HISTORY — DX: Other chronic pain: G89.29

## 2022-10-31 MED ORDER — LOSARTAN POTASSIUM 25 MG PO TABS: 25.0000 mg | ORAL_TABLET | Freq: Every day | ORAL | 2 refills | Status: DC

## 2022-10-31 NOTE — Patient Instructions (Signed)
Please check your blood pressure 2-3 x a week and bring a log back to monitor control.
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
## 2022-11-01 NOTE — Telephone Encounter (Signed)
Pt sent mychart message about labs from yesterday. Alexis, please advise.

## 2022-11-04 ENCOUNTER — Other Ambulatory Visit (HOSPITAL_BASED_OUTPATIENT_CLINIC_OR_DEPARTMENT_OTHER): Payer: Self-pay | Admitting: Family Medicine

## 2022-11-04 DIAGNOSIS — E7849 Other hyperlipidemia: Secondary | ICD-10-CM

## 2022-11-04 MED ORDER — ATORVASTATIN CALCIUM 20 MG PO TABS: 20.0000 mg | ORAL_TABLET | Freq: Every day | ORAL | 3 refills | Status: DC

## 2022-11-04 NOTE — Progress Notes (Signed)
Hi Lori Russell,  Your cholesterol is elevated likely related to familial hypercholesterolemia. I recommend starting the Omega 3 Fish oil (at least 1000mg  daily) and we can also start medication as well. I recommend taking the medication at nighttime before bed. Continue to watch diet and exercise regularly. Additionally, your liver enzymes were elevated and I agree to proceed with the MRI. Your blood counts do indicate mild anemia and I would recommend starting Vitamin B12 and/or Vitamin B6 to assist. Your A1c and thyroid labs were normal. We will discuss in more depth at your appt.

## 2022-11-12 DIAGNOSIS — W268XXA Contact with other sharp object(s), not elsewhere classified, initial encounter: Secondary | ICD-10-CM | POA: Diagnosis not present

## 2022-11-12 DIAGNOSIS — S61213A Laceration without foreign body of left middle finger without damage to nail, initial encounter: Secondary | ICD-10-CM | POA: Diagnosis not present

## 2022-11-27 NOTE — Progress Notes (Unsigned)
Subjective:   Lori Russell 02/10/1966  11/28/2022   CC: No chief complaint on file.   HPI: Lori Russell is a 57 y.o. female who presents for a routine health maintenance exam.  Labs collected at time of visit.    HEALTH SCREENINGS: - Vision Screening: {Blank single:19197::"pap done","not applicable","up to date","done elsewhere"} - Dental Visits: {Blank single:19197::"pap done","not applicable","up to date","done elsewhere"} - Pap smear: {Blank single:19197::"pap done","not applicable","up to date","done elsewhere"} - Breast Exam: {Blank single:19197::"pap done","not applicable","up to date","done elsewhere"} - STD Screening: {Blank single:19197::"Up to date","Ordered today","Not applicable","Declined","Done elsewhere"} - Mammogram (40+): {Blank single:19197::"Up to date","Ordered today","Not applicable","Refused","Done elsewhere"}  - Colonoscopy (45+): {Blank single:19197::"Up to date","Ordered today","Not applicable","Refused","Done elsewhere"}  - Bone Density (65+ or under 65 with predisposing conditions): {Blank single:19197::"Up to date","Ordered today","Not applicable","Refused","Done elsewhere"}  - Lung CA screening with low-dose CT:  {Blank single:19197::"Up to date","Ordered today","Not applicable","Declined","Done elsewhere"} Adults age 59-80 who are current cigarette smokers or quit within the last 15 years. Must have 20 pack year history.   Depression and Anxiety Screen done today and results listed below:     10/31/2022    2:17 PM  Depression screen PHQ 2/9  Decreased Interest 0  Down, Depressed, Hopeless 0  PHQ - 2 Score 0  Altered sleeping 1  Tired, decreased energy 0  Change in appetite 0  Feeling bad or failure about yourself  0  Trouble concentrating 0  Moving slowly or fidgety/restless 0  Suicidal thoughts 0  PHQ-9 Score 1  Difficult doing work/chores Not difficult at all      10/31/2022    2:17 PM  GAD 7 : Generalized Anxiety Score   Nervous, Anxious, on Edge 1  Control/stop worrying 1  Worry too much - different things 1  Trouble relaxing 1  Restless 0  Easily annoyed or irritable 0  Afraid - awful might happen 0  Total GAD 7 Score 4  Anxiety Difficulty Somewhat difficult    IMMUNIZATIONS: - Tdap: Tetanus vaccination status reviewed: {tetanus status:315746}. - HPV: {Blank single:19197::"Up to date","Administered today","Not applicable","Refused","Given elsewhere"} - Influenza: {Blank single:19197::"Up to date","Administered today","Postponed to flu season","Refused","Given elsewhere"} - Pneumovax: {Blank single:19197::"Up to date","Administered today","Not applicable","Refused","Given elsewhere"} - Prevnar 20: {Blank single:19197::"Up to date","Administered today","Not applicable","Refused","Given elsewhere"} - Zostavax (50+): {Blank single:19197::"Up to date","Administered today","Not applicable","Refused","Given elsewhere"}   Past medical history, surgical history, medications, allergies, family history and social history reviewed with patient today and changes made to appropriate areas of the chart.   Past Medical History:  Diagnosis Date   Anxiety    Depression    Other chronic pain 10/31/2022    No past surgical history on file.  Current Outpatient Medications on File Prior to Visit  Medication Sig   atorvastatin (LIPITOR) 20 MG tablet Take 1 tablet (20 mg total) by mouth daily.   losartan (COZAAR) 25 MG tablet Take 1 tablet (25 mg total) by mouth daily.   No current facility-administered medications on file prior to visit.    No Known Allergies   Social History   Socioeconomic History   Marital status: Divorced    Spouse name: Not on file   Number of children: Not on file   Years of education: Not on file   Highest education level: Not on file  Occupational History   Not on file  Tobacco Use   Smoking status: Former    Current packs/day: 0.00    Types: Cigarettes    Start date:  08/08/1979    Quit date: 08/07/2009  Years since quitting: 13.3   Smokeless tobacco: Never  Vaping Use   Vaping status: Never Used  Substance and Sexual Activity   Alcohol use: Yes    Alcohol/week: 2.0 - 3.0 standard drinks of alcohol    Types: 2 - 3 Glasses of wine per week    Comment: every day    Drug use: No   Sexual activity: Not Currently  Other Topics Concern   Not on file  Social History Narrative   Not on file   Social Determinants of Health   Financial Resource Strain: Not on file  Food Insecurity: Not on file  Transportation Needs: Not on file  Physical Activity: Not on file  Stress: Not on file  Social Connections: Not on file  Intimate Partner Violence: Not on file   Social History   Tobacco Use  Smoking Status Former   Current packs/day: 0.00   Types: Cigarettes   Start date: 08/08/1979   Quit date: 08/07/2009   Years since quitting: 13.3  Smokeless Tobacco Never   Social History   Substance and Sexual Activity  Alcohol Use Yes   Alcohol/week: 2.0 - 3.0 standard drinks of alcohol   Types: 2 - 3 Glasses of wine per week   Comment: every day     Family History  Problem Relation Age of Onset   Hyperlipidemia Mother    Hyperlipidemia Sister    Anxiety disorder Sister      ROS: Denies fever, fatigue, unexplained weight loss/gain, chest pain, SHOB, and palpitations. Denies neurological deficits, gastrointestinal or genitourinary complaints, and skin changes.   Objective:   There were no vitals filed for this visit.  GENERAL APPEARANCE: Well-appearing, in NAD. Well nourished.  SKIN: Pink, warm and dry. Turgor normal. No rash, lesion, ulceration, or ecchymoses. Hair evenly distributed.  HEENT: HEAD: Normocephalic.  EYES: PERRLA. EOMI. Lids intact w/o defect. Sclera white, Conjunctiva pink w/o exudate.  EARS: External ear w/o redness, swelling, masses or lesions. EAC clear. TM's intact, translucent w/o bulging, appropriate landmarks visualized.  Appropriate acuity to conversational tones.  NOSE: Septum midline w/o deformity. Nares patent, mucosa pink and non-inflamed w/o drainage. No sinus tenderness.  THROAT: Uvula midline. Oropharynx clear. Tonsils non-inflamed w/o exudate. Oral mucosa pink and moist.  NECK: Supple, Trachea midline. Full ROM w/o pain or tenderness. No lymphadenopathy. Thyroid non-tender w/o enlargement or palpable masses.  BREASTS: Breasts pendulous, symmetrical, and w/o palpable masses. Nipples everted and w/o discharge. No rash or skin retraction. No axillary or supraclavicular lymphadenopathy.  RESPIRATORY: Chest wall symmetrical w/o masses. Respirations even and non-labored. Breath sounds clear to auscultation bilaterally. No wheezes, rales, rhonchi, or crackles. CARDIAC: S1, S2 present, regular rate and rhythm. No gallops, murmurs, rubs, or clicks. PMI w/o lifts, heaves, or thrills. No carotid bruits. Capillary refill <2 seconds. Peripheral pulses 2+ bilaterally. GI: Abdomen soft w/o distention. Normoactive bowel sounds. No palpable masses or tenderness. No guarding or rebound tenderness. Liver and spleen w/o tenderness or enlargement. No CVA tenderness.  GU: External genitalia without erythema, lesions, or masses. No lymphadenopathy. Vaginal mucosa pink and moist without exudate, lesions, or ulcerations. Cervix pink without discharge. Cervical os closed. Uterus and adnexae palpable, not enlarged, and w/o tenderness. No palpable masses.  MSK: Muscle tone and strength appropriate for age, w/o atrophy or abnormal movement.  EXTREMITIES: Active ROM intact, w/o tenderness, crepitus, or contracture. No obvious joint deformities or effusions. No clubbing, edema, or cyanosis.  NEUROLOGIC: CN's II-XII intact. Motor strength symmetrical with no obvious weakness. No  sensory deficits. DTR's 2+ symmetric bilaterally. Steady, even gait.  PSYCH/MENTAL STATUS: Alert, oriented x 3. Cooperative, appropriate mood and affect.   Chaperoned  by Cristy Hilts, CMA   Results for orders placed or performed in visit on 10/31/22  Lipid panel  Result Value Ref Range   Cholesterol, Total 314 (H) 100 - 199 mg/dL   Triglycerides 161 0 - 149 mg/dL   HDL 84 >09 mg/dL   VLDL Cholesterol Cal 18 5 - 40 mg/dL   LDL Chol Calc (NIH) 604 (H) 0 - 99 mg/dL   LDL CALC COMMENT: Comment    Chol/HDL Ratio 3.7 0.0 - 4.4 ratio  Comprehensive metabolic panel  Result Value Ref Range   Glucose 88 70 - 99 mg/dL   BUN 9 6 - 24 mg/dL   Creatinine, Ser 5.40 0.57 - 1.00 mg/dL   eGFR 981 >19 JY/NWG/9.56   BUN/Creatinine Ratio 13 9 - 23   Sodium 138 134 - 144 mmol/L   Potassium 5.2 3.5 - 5.2 mmol/L   Chloride 100 96 - 106 mmol/L   CO2 24 20 - 29 mmol/L   Calcium 10.0 8.7 - 10.2 mg/dL   Total Protein 7.2 6.0 - 8.5 g/dL   Albumin 5.0 (H) 3.8 - 4.9 g/dL   Globulin, Total 2.2 1.5 - 4.5 g/dL   Bilirubin Total 0.8 0.0 - 1.2 mg/dL   Alkaline Phosphatase 50 44 - 121 IU/L   AST 115 (H) 0 - 40 IU/L   ALT 90 (H) 0 - 32 IU/L  CBC with Differential/Platelet  Result Value Ref Range   WBC 5.1 3.4 - 10.8 x10E3/uL   RBC 4.13 3.77 - 5.28 x10E6/uL   Hemoglobin 14.7 11.1 - 15.9 g/dL   Hematocrit 21.3 08.6 - 46.6 %   MCV 106 (H) 79 - 97 fL   MCH 35.6 (H) 26.6 - 33.0 pg   MCHC 33.6 31.5 - 35.7 g/dL   RDW 57.8 (L) 46.9 - 62.9 %   Platelets 276 150 - 450 x10E3/uL   Neutrophils 43 Not Estab. %   Lymphs 40 Not Estab. %   Monocytes 12 Not Estab. %   Eos 4 Not Estab. %   Basos 1 Not Estab. %   Neutrophils Absolute 2.3 1.4 - 7.0 x10E3/uL   Lymphocytes Absolute 2.1 0.7 - 3.1 x10E3/uL   Monocytes Absolute 0.6 0.1 - 0.9 x10E3/uL   EOS (ABSOLUTE) 0.2 0.0 - 0.4 x10E3/uL   Basophils Absolute 0.0 0.0 - 0.2 x10E3/uL   Immature Granulocytes 0 Not Estab. %   Immature Grans (Abs) 0.0 0.0 - 0.1 x10E3/uL  Hemoglobin A1c  Result Value Ref Range   Hgb A1c MFr Bld 5.0 4.8 - 5.6 %   Est. average glucose Bld gHb Est-mCnc 97 mg/dL  TSH  Result Value Ref Range   TSH 1.370 0.450  - 4.500 uIU/mL    Assessment & Plan:  ***  No orders of the defined types were placed in this encounter.   PATIENT COUNSELING:  - Encouraged a healthy well-balanced diet. Patient may adjust caloric intake to maintain or achieve ideal body weight. May reduce intake of dietary saturated fat and total fat and have adequate dietary potassium and calcium preferably from fresh fruits, vegetables, and low-fat dairy products.   - Advised to avoid cigarette smoking. - Discussed with the patient that most people either abstain from alcohol or drink within safe limits (<=14/week and <=4 drinks/occasion for males, <=7/weeks and <= 3 drinks/occasion for females) and that the risk  for alcohol disorders and other health effects rises proportionally with the number of drinks per week and how often a drinker exceeds daily limits. - Discussed cessation/primary prevention of drug use and availability of treatment for abuse.  - Discussed sexually transmitted diseases, avoidance of unintended pregnancy and contraceptive alternatives.  - Stressed the importance of regular exercise - Injury prevention: Discussed safety belts, safety helmets, smoke detector, smoking near bedding or upholstery.  - Dental health: Discussed importance of regular tooth brushing, flossing, and dental visits.   NEXT PREVENTATIVE PHYSICAL DUE IN 1 YEAR.  No follow-ups on file.  Patient to reach out to office if new, worrisome, or unresolved symptoms arise or if no improvement in patient's condition. Patient verbalized understanding and is agreeable to treatment plan. All questions answered to patient's satisfaction.   Of note, portions of this note may have been created with voice recognition software Physicist, medical). While this note has been edited for accuracy, occasional wrong-word or 'sound-a-like' substitutions may have occurred due to the inherent limitations of voice recognition software.  Yolanda Manges, FNP

## 2022-11-28 ENCOUNTER — Ambulatory Visit (INDEPENDENT_AMBULATORY_CARE_PROVIDER_SITE_OTHER): Payer: 59 | Admitting: Family Medicine

## 2022-11-28 ENCOUNTER — Other Ambulatory Visit (HOSPITAL_COMMUNITY)
Admission: RE | Admit: 2022-11-28 | Discharge: 2022-11-28 | Disposition: A | Discharge status: Home / Self Care | Payer: 59 | Source: Ambulatory Visit | Attending: Family Medicine | Admitting: Family Medicine

## 2022-11-28 ENCOUNTER — Encounter (HOSPITAL_BASED_OUTPATIENT_CLINIC_OR_DEPARTMENT_OTHER): Payer: Self-pay | Admitting: Family Medicine

## 2022-11-28 VITALS — BP 124/82 | HR 65 | Ht 67.5 in | Wt 146.0 lb

## 2022-11-28 DIAGNOSIS — Z0000 Encounter for general adult medical examination without abnormal findings: Secondary | ICD-10-CM

## 2022-11-28 DIAGNOSIS — E7801 Familial hypercholesterolemia: Secondary | ICD-10-CM | POA: Diagnosis not present

## 2022-11-28 DIAGNOSIS — Z124 Encounter for screening for malignant neoplasm of cervix: Secondary | ICD-10-CM | POA: Insufficient documentation

## 2022-11-28 DIAGNOSIS — Z23 Encounter for immunization: Secondary | ICD-10-CM | POA: Diagnosis not present

## 2022-11-28 DIAGNOSIS — R7989 Other specified abnormal findings of blood chemistry: Secondary | ICD-10-CM

## 2022-11-28 DIAGNOSIS — Z1231 Encounter for screening mammogram for malignant neoplasm of breast: Secondary | ICD-10-CM | POA: Diagnosis not present

## 2022-11-28 NOTE — Patient Instructions (Signed)
Call [phone] to schedule your MRI Abdomen.   You will be called by Breast center to schedule your mammogram.   If Bp is lower than 110/60, cut losartan in half.

## 2022-11-29 LAB — CYTOLOGY - PAP
Comment: NEGATIVE
Diagnosis: NEGATIVE
High risk HPV: NEGATIVE

## 2022-12-02 NOTE — Progress Notes (Signed)
Pap Smear is normal. Health maintenance updated. Repeat pap in 3 years.

## 2023-01-03 ENCOUNTER — Other Ambulatory Visit: Payer: Self-pay | Admitting: Family Medicine

## 2023-01-03 DIAGNOSIS — Z1231 Encounter for screening mammogram for malignant neoplasm of breast: Secondary | ICD-10-CM

## 2023-01-08 ENCOUNTER — Ambulatory Visit
Admission: RE | Admit: 2023-01-08 | Discharge: 2023-01-08 | Disposition: A | Discharge status: Home / Self Care | Payer: 59 | Source: Ambulatory Visit

## 2023-01-08 ENCOUNTER — Other Ambulatory Visit: Payer: Self-pay | Admitting: Family Medicine

## 2023-01-08 DIAGNOSIS — Z1231 Encounter for screening mammogram for malignant neoplasm of breast: Secondary | ICD-10-CM

## 2023-02-24 ENCOUNTER — Other Ambulatory Visit (HOSPITAL_BASED_OUTPATIENT_CLINIC_OR_DEPARTMENT_OTHER): Payer: Self-pay | Admitting: *Deleted

## 2023-02-24 DIAGNOSIS — R7989 Other specified abnormal findings of blood chemistry: Secondary | ICD-10-CM

## 2023-02-24 DIAGNOSIS — E7801 Familial hypercholesterolemia: Secondary | ICD-10-CM

## 2023-02-25 LAB — LIPID PANEL
Chol/HDL Ratio: 3.3 {ratio} (ref 0.0–4.4)
Cholesterol, Total: 199 mg/dL (ref 100–199)
HDL: 61 mg/dL (ref >39)
LDL Chol Calc (NIH): 112 mg/dL — ABNORMAL HIGH (ref 0–99)
Triglycerides: 152 mg/dL — ABNORMAL HIGH (ref 0–149)
VLDL Cholesterol Cal: 26 mg/dL (ref 5–40)

## 2023-02-25 LAB — COMPREHENSIVE METABOLIC PANEL
ALT: 18 [IU]/L (ref 0–32)
AST: 22 [IU]/L (ref 0–40)
Albumin: 4.9 g/dL (ref 3.8–4.9)
Alkaline Phosphatase: 55 [IU]/L (ref 44–121)
BUN/Creatinine Ratio: 13 (ref 9–23)
BUN: 9 mg/dL (ref 6–24)
Bilirubin Total: 0.3 mg/dL (ref 0.0–1.2)
CO2: 21 mmol/L (ref 20–29)
Calcium: 9.9 mg/dL (ref 8.7–10.2)
Chloride: 102 mmol/L (ref 96–106)
Creatinine, Ser: 0.7 mg/dL (ref 0.57–1.00)
Globulin, Total: 2.2 g/dL (ref 1.5–4.5)
Glucose: 91 mg/dL (ref 70–99)
Potassium: 4.2 mmol/L (ref 3.5–5.2)
Sodium: 141 mmol/L (ref 134–144)
Total Protein: 7.1 g/dL (ref 6.0–8.5)
eGFR: 100 mL/min/{1.73_m2} (ref >59)

## 2023-02-27 ENCOUNTER — Ambulatory Visit (HOSPITAL_BASED_OUTPATIENT_CLINIC_OR_DEPARTMENT_OTHER): Payer: 59 | Admitting: Family Medicine

## 2023-02-27 ENCOUNTER — Encounter (HOSPITAL_BASED_OUTPATIENT_CLINIC_OR_DEPARTMENT_OTHER): Payer: Self-pay | Admitting: Family Medicine

## 2023-02-27 VITALS — BP 112/89 | HR 73 | Temp 97.7°F | Ht 67.5 in | Wt 137.6 lb

## 2023-02-27 DIAGNOSIS — I10 Essential (primary) hypertension: Secondary | ICD-10-CM

## 2023-02-27 DIAGNOSIS — K769 Liver disease, unspecified: Secondary | ICD-10-CM

## 2023-02-27 DIAGNOSIS — E7801 Familial hypercholesterolemia: Secondary | ICD-10-CM

## 2023-02-27 NOTE — Patient Instructions (Addendum)
Increase Omega 3's in diet to lower triglycerides    MRI ABDOMEN:   [email or web address]  [address] [place], [place], [place] [address]  < 1 mi [phone]

## 2023-02-27 NOTE — Progress Notes (Signed)
Subjective:   Lori Russell 15-Aug-1965 02/27/2023  Chief Complaint  Patient presents with   Medical Management of Chronic Issues    Follow up on HTN & labs.    HPI: Lori Russell presents today for re-assessment and management of chronic medical conditions.  HYPERLIPIDEMIA: Lori Russell presents for the medical management of hyperlipidemia.  Patient's current HLD regimen is: Atorvastatin 20 mg QD Patient is currently taking prescribed medications for HLD.  Adhering to heathy diet: Yes, she has made significant diet changes, decreased alcohol intake and increasing in heart healthy diet Exercising regularly: Yes Denies myalgias.  Lab Results  Component Value Date   CHOL 199 02/24/2023   HDL 61 02/24/2023   LDLCALC 112 (H) 02/24/2023   TRIG 152 (H) 02/24/2023   CHOLHDL 3.3 02/24/2023   The 10-year ASCVD risk score (Arnett DK, et al., 2019) is: 2.8%   Values used to calculate the score:     Age: 3 years     Sex: Female     Is Non-Hispanic African American: No     Diabetic: No     Tobacco smoker: No     Systolic Blood Pressure: 112 mmHg     Is BP treated: Yes     HDL Cholesterol: 61 mg/dL     Total Cholesterol: 199 mg/dL   HYPERTENSION: Lori Russell presents for the medical management of hypertension.  Patient's current hypertension medication regimen is: Losartan 25mg  qd Patient is  currently taking prescribed medications for HTN.  Patient is  regularly keeping a check on BP at home.  Adhering to low sodium diet: Yes Exercising Regularly: Yes Denies headache, dizziness, CP, SHOB, vision changes.    BP Readings from Last 3 Encounters:  02/27/23 112/89  11/28/22 124/82  10/31/22 (!) 158/98    LFT ELEVATION:  Patient has made significant dietary changes, decreased alcohol intake and is exercising regularly. She has had approx. 9lbs weight loss in the past 3 months with lifestyle modification. She has not completed recommended MRI  abdomen as she has not been contacted to schedule for follow up of hepatic lesion from previous imaging.   Lab Results  Component Value Date   ALT 18 02/24/2023   AST 22 02/24/2023   ALKPHOS 55 02/24/2023   BILITOT 0.3 02/24/2023    Wt Readings from Last 3 Encounters:  02/27/23 137 lb 9.6 oz (62.4 kg)  11/28/22 146 lb (66.2 kg)  10/31/22 145 lb 8 oz (66 kg)    The following portions of the patient's history were reviewed and updated as appropriate: past medical history, past surgical history, family history, social history, allergies, medications, and problem list.   Patient Active Problem List   Diagnosis Date Noted   Primary hypertension 10/31/2022   Hyperlipidemia, unspecified 10/31/2022   Estrogen deficiency 10/31/2022   History of depression 10/31/2022   Hepatic lesion 10/31/2022   Foot pain 06/21/2010   FASCIITIS, PLANTAR 06/04/2010   Pain in limb 06/04/2010   Past Medical History:  Diagnosis Date   Anxiety    Depression    Other chronic pain 10/31/2022   Past Surgical History:  Procedure Laterality Date   AUGMENTATION MAMMAPLASTY Bilateral    Family History  Problem Relation Age of Onset   Hyperlipidemia Mother    Hyperlipidemia Sister    Anxiety disorder Sister    Outpatient Medications Prior to Visit  Medication Sig Dispense Refill   atorvastatin (LIPITOR) 20 MG tablet Take 1 tablet (20 mg  total) by mouth daily. 60 tablet 3   losartan (COZAAR) 25 MG tablet Take 1 tablet (25 mg total) by mouth daily. 90 tablet 2   No facility-administered medications prior to visit.   No Known Allergies   ROS: A complete ROS was performed with pertinent positives/negatives noted in the HPI. The remainder of the ROS are negative.    Objective:   Today's Vitals   02/27/23 1551  BP: 112/89  Pulse: 73  Temp: 97.7 F (36.5 C)  TempSrc: Oral  SpO2: 100%  Weight: 137 lb 9.6 oz (62.4 kg)  Height: 5' 7.5" (1.715 m)  PainSc: 0-No pain    Physical Exam           GENERAL: Well-appearing, in NAD. Well nourished.  SKIN: Pink, warm and dry. No rash, lesion, ulceration, or ecchymoses.  Head: Normocephalic. NECK: Trachea midline. Full ROM w/o pain or tenderness.  RESPIRATORY: Chest wall symmetrical. Respirations even and non-labored.  EXTREMITIES: Without clubbing, cyanosis, or edema.  NEUROLOGIC: No motor or sensory deficits. Steady, even gait. C2-C12 intact.  PSYCH/MENTAL STATUS: Alert, oriented x 3. Cooperative, appropriate mood and affect.   There are no preventive care reminders to display for this patient.    Assessment & Plan:  1. Liver disease 2. Hepatic lesion LFT's improved significantly. Patient will continue on lifestyle modifications and recheck with AE. MRI abdomen re-ordered for hepatic lesion follow up and patient will be contacted to schedule with [facility].  - MR Abdomen W Wo Contrast; Future   3. Familial hypercholesterolemia Improved and well controlled. Continue lipitor 20mg  daily, lifestyle modifications and increase Omega 3's to lower triglycerides. Repeat in 9 months.  - Comprehensive metabolic panel; Future - Lipid panel; Future  4. Primary hypertension Stable and well controlled. Continue on current regimen.  - CBC with Differential/Platelet; Future - Comprehensive metabolic panel; Future  Lab Orders         CBC with Differential/Platelet         Comprehensive metabolic panel         Lipid panel      Return in about 9 months (around 12/08/2023) for ANNUAL PHYSICAL (fasting labs prior) .    Patient to reach out to office if new, worrisome, or unresolved symptoms arise or if no improvement in patient's condition. Patient verbalized understanding and is agreeable to treatment plan. All questions answered to patient's satisfaction.    Hilbert Bible, [place]

## 2023-05-02 ENCOUNTER — Encounter (HOSPITAL_BASED_OUTPATIENT_CLINIC_OR_DEPARTMENT_OTHER): Payer: Self-pay | Admitting: Family Medicine

## 2023-06-19 DIAGNOSIS — N951 Menopausal and female climacteric states: Secondary | ICD-10-CM | POA: Diagnosis not present

## 2023-06-19 DIAGNOSIS — E039 Hypothyroidism, unspecified: Secondary | ICD-10-CM | POA: Diagnosis not present

## 2023-06-25 DIAGNOSIS — Z79890 Hormone replacement therapy (postmenopausal): Secondary | ICD-10-CM | POA: Diagnosis not present

## 2023-06-25 DIAGNOSIS — G479 Sleep disorder, unspecified: Secondary | ICD-10-CM | POA: Diagnosis not present

## 2023-06-25 DIAGNOSIS — Z6821 Body mass index (BMI) 21.0-21.9, adult: Secondary | ICD-10-CM | POA: Diagnosis not present

## 2023-06-25 DIAGNOSIS — R6882 Decreased libido: Secondary | ICD-10-CM | POA: Diagnosis not present

## 2023-06-25 DIAGNOSIS — N951 Menopausal and female climacteric states: Secondary | ICD-10-CM | POA: Diagnosis not present

## 2023-06-25 DIAGNOSIS — E039 Hypothyroidism, unspecified: Secondary | ICD-10-CM | POA: Diagnosis not present

## 2023-07-21 DIAGNOSIS — E039 Hypothyroidism, unspecified: Secondary | ICD-10-CM | POA: Diagnosis not present

## 2023-07-21 DIAGNOSIS — N951 Menopausal and female climacteric states: Secondary | ICD-10-CM | POA: Diagnosis not present

## 2023-07-23 DIAGNOSIS — E039 Hypothyroidism, unspecified: Secondary | ICD-10-CM | POA: Diagnosis not present

## 2023-07-23 DIAGNOSIS — R6882 Decreased libido: Secondary | ICD-10-CM | POA: Diagnosis not present

## 2023-07-23 DIAGNOSIS — Z6820 Body mass index (BMI) 20.0-20.9, adult: Secondary | ICD-10-CM | POA: Diagnosis not present

## 2023-07-23 DIAGNOSIS — R4586 Emotional lability: Secondary | ICD-10-CM | POA: Diagnosis not present

## 2023-07-23 DIAGNOSIS — N951 Menopausal and female climacteric states: Secondary | ICD-10-CM | POA: Diagnosis not present

## 2023-08-01 ENCOUNTER — Other Ambulatory Visit (HOSPITAL_BASED_OUTPATIENT_CLINIC_OR_DEPARTMENT_OTHER): Payer: Self-pay | Admitting: Family Medicine

## 2023-08-01 DIAGNOSIS — I10 Essential (primary) hypertension: Secondary | ICD-10-CM

## 2023-10-21 DIAGNOSIS — E039 Hypothyroidism, unspecified: Secondary | ICD-10-CM | POA: Diagnosis not present

## 2023-10-28 DIAGNOSIS — E039 Hypothyroidism, unspecified: Secondary | ICD-10-CM | POA: Diagnosis not present

## 2023-10-28 DIAGNOSIS — G479 Sleep disorder, unspecified: Secondary | ICD-10-CM | POA: Diagnosis not present

## 2023-10-28 DIAGNOSIS — N951 Menopausal and female climacteric states: Secondary | ICD-10-CM | POA: Diagnosis not present

## 2023-10-28 DIAGNOSIS — Z6820 Body mass index (BMI) 20.0-20.9, adult: Secondary | ICD-10-CM | POA: Diagnosis not present

## 2023-10-28 DIAGNOSIS — N898 Other specified noninflammatory disorders of vagina: Secondary | ICD-10-CM | POA: Diagnosis not present

## 2023-11-04 ENCOUNTER — Other Ambulatory Visit (HOSPITAL_BASED_OUTPATIENT_CLINIC_OR_DEPARTMENT_OTHER): Payer: Self-pay | Admitting: Family Medicine

## 2023-11-04 DIAGNOSIS — E7849 Other hyperlipidemia: Secondary | ICD-10-CM

## 2023-12-01 ENCOUNTER — Other Ambulatory Visit (HOSPITAL_BASED_OUTPATIENT_CLINIC_OR_DEPARTMENT_OTHER): Payer: 59

## 2023-12-01 ENCOUNTER — Encounter (HOSPITAL_BASED_OUTPATIENT_CLINIC_OR_DEPARTMENT_OTHER): Payer: 59 | Admitting: Family Medicine

## 2023-12-08 ENCOUNTER — Encounter (HOSPITAL_BASED_OUTPATIENT_CLINIC_OR_DEPARTMENT_OTHER): Payer: 59 | Admitting: Family Medicine

## 2023-12-17 NOTE — Progress Notes (Signed)
 EMILYANN BANKA                                          MRN: [id number]   12/17/2023   The [facility] Quality Team Specialist reviewed this patient medical record for the purposes of chart review for care gap closure. The following were reviewed: chart review for care gap closure-controlling blood pressure.    [facility] Quality Team

## 2023-12-18 DIAGNOSIS — D2261 Melanocytic nevi of right upper limb, including shoulder: Secondary | ICD-10-CM | POA: Diagnosis not present

## 2023-12-18 DIAGNOSIS — L578 Other skin changes due to chronic exposure to nonionizing radiation: Secondary | ICD-10-CM | POA: Diagnosis not present

## 2023-12-18 DIAGNOSIS — Q828 Other specified congenital malformations of skin: Secondary | ICD-10-CM | POA: Diagnosis not present

## 2023-12-18 DIAGNOSIS — D225 Melanocytic nevi of trunk: Secondary | ICD-10-CM | POA: Diagnosis not present

## 2023-12-18 DIAGNOSIS — L821 Other seborrheic keratosis: Secondary | ICD-10-CM | POA: Diagnosis not present

## 2023-12-18 DIAGNOSIS — Z86018 Personal history of other benign neoplasm: Secondary | ICD-10-CM | POA: Diagnosis not present

## 2023-12-18 DIAGNOSIS — B078 Other viral warts: Secondary | ICD-10-CM | POA: Diagnosis not present

## 2023-12-18 DIAGNOSIS — L720 Epidermal cyst: Secondary | ICD-10-CM | POA: Diagnosis not present

## 2024-01-27 DIAGNOSIS — E039 Hypothyroidism, unspecified: Secondary | ICD-10-CM | POA: Diagnosis not present

## 2024-01-27 DIAGNOSIS — N951 Menopausal and female climacteric states: Secondary | ICD-10-CM | POA: Diagnosis not present

## 2024-01-29 DIAGNOSIS — N951 Menopausal and female climacteric states: Secondary | ICD-10-CM | POA: Diagnosis not present

## 2024-01-29 DIAGNOSIS — G479 Sleep disorder, unspecified: Secondary | ICD-10-CM | POA: Diagnosis not present

## 2024-01-29 DIAGNOSIS — Z6821 Body mass index (BMI) 21.0-21.9, adult: Secondary | ICD-10-CM | POA: Diagnosis not present

## 2024-01-29 DIAGNOSIS — R6882 Decreased libido: Secondary | ICD-10-CM | POA: Diagnosis not present

## 2024-02-22 DIAGNOSIS — R300 Dysuria: Secondary | ICD-10-CM | POA: Diagnosis not present

## 2024-02-22 DIAGNOSIS — R82998 Other abnormal findings in urine: Secondary | ICD-10-CM | POA: Diagnosis not present

## 2024-03-04 ENCOUNTER — Other Ambulatory Visit: Payer: Self-pay | Admitting: Family Medicine

## 2024-03-04 DIAGNOSIS — Z1231 Encounter for screening mammogram for malignant neoplasm of breast: Secondary | ICD-10-CM

## 2024-03-08 ENCOUNTER — Other Ambulatory Visit: Payer: Self-pay | Admitting: Family Medicine

## 2024-03-08 ENCOUNTER — Inpatient Hospital Stay: Admission: RE | Admit: 2024-03-08 | Discharge: 2024-03-08 | Attending: Family Medicine | Admitting: Family Medicine

## 2024-03-08 DIAGNOSIS — Z1231 Encounter for screening mammogram for malignant neoplasm of breast: Secondary | ICD-10-CM

## 2024-03-15 ENCOUNTER — Ambulatory Visit (HOSPITAL_BASED_OUTPATIENT_CLINIC_OR_DEPARTMENT_OTHER)

## 2024-03-22 ENCOUNTER — Ambulatory Visit (HOSPITAL_BASED_OUTPATIENT_CLINIC_OR_DEPARTMENT_OTHER)

## 2024-03-22 ENCOUNTER — Encounter (HOSPITAL_BASED_OUTPATIENT_CLINIC_OR_DEPARTMENT_OTHER): Payer: Self-pay

## 2024-03-22 VITALS — BP 110/65 | HR 67 | Ht 67.5 in | Wt 134.7 lb

## 2024-03-22 DIAGNOSIS — Z0000 Encounter for general adult medical examination without abnormal findings: Secondary | ICD-10-CM

## 2024-03-22 DIAGNOSIS — H6121 Impacted cerumen, right ear: Secondary | ICD-10-CM

## 2024-03-22 DIAGNOSIS — Z23 Encounter for immunization: Secondary | ICD-10-CM

## 2024-03-22 DIAGNOSIS — I10 Essential (primary) hypertension: Secondary | ICD-10-CM

## 2024-03-22 DIAGNOSIS — E7849 Other hyperlipidemia: Secondary | ICD-10-CM

## 2024-03-22 MED ORDER — ATORVASTATIN CALCIUM 20 MG PO TABS: 20.0000 mg | ORAL_TABLET | Freq: Every day | ORAL | 3 refills | Status: AC

## 2024-03-22 MED ORDER — LOSARTAN POTASSIUM 25 MG PO TABS: 25.0000 mg | ORAL_TABLET | Freq: Every day | ORAL | 2 refills | Status: AC

## 2024-03-22 NOTE — Progress Notes (Signed)
 "  Subjective:   Lori Russell 1965-10-09  03/22/2024   CC: Chief Complaint  Patient presents with   Annual Exam    Patient is here for her annual exam. Denies any concerns for today's visit. States she is interested in the flu vaccine.     HPI: Lori Russell is a 58 y.o. female who presents for a routine health maintenance exam.  Labs collected at time of visit.   HYPERTENSION: Lori Russell presents for the medical management of hypertension.  Patient's current hypertension medication regimen is: Losartan  25mg  dialy Patient is  currently taking prescribed medications for HTN.  Patient is not regularly keeping a check on BP at home.  Adhering to low sodium diet: yes Exercising Regularly: yes Denies headache, dizziness, CP, SHOB, vision changes.   BP Readings from Last 3 Encounters:  03/22/24 110/65  02/27/23 112/89  11/28/22 124/82   HYPERLIPIDEMIA: Lori Russell presents for the medical management of hyperlipidemia.  Patient's current HLD regimen is: atorvastatin  20mg  Patient is  currently taking prescribed medications for HLD.  Adhering to heathy diet: yes Exercising regularly: yes Denies myalgias.  Lab Results  Component Value Date   CHOL 199 02/24/2023   HDL 61 02/24/2023   LDLCALC 112 (H) 02/24/2023   TRIG 152 (H) 02/24/2023   CHOLHDL 3.3 02/24/2023      HEALTH SCREENINGS: - Vision Screening: up to date - Dental Visits: up to date - Pap smear: up to date - Breast Exam: up to date - STD Screening: Declined - Mammogram (40+): Up to date  - Colonoscopy (45+): Up to date  - Bone Density (65+ or under 65 with predisposing conditions): Not applicable  - Lung CA screening with low-dose CT:  Declined Adults age 110-80 who are current cigarette smokers or quit within the last 15 years. Must have 20 pack year history.   Depression and Anxiety Screen done today and results listed below:     03/22/2024    3:43 PM 02/27/2023    3:54 PM  02/27/2023    3:53 PM 11/28/2022    1:14 PM 10/31/2022    2:17 PM  Depression screen PHQ 2/9  Decreased Interest 0 0 0 0 0  Down, Depressed, Hopeless 0 0 0 0 0  PHQ - 2 Score 0 0 0 0 0  Altered sleeping 0 0  0 1  Tired, decreased energy 0 0  0 0  Change in appetite 0 0  0 0  Feeling bad or failure about yourself  0 0  0 0  Trouble concentrating 0 0  0 0  Moving slowly or fidgety/restless 0 0  0 0  Suicidal thoughts 0 0  0 0  PHQ-9 Score 0 0   0  1   Difficult doing work/chores Not difficult at all Not difficult at all  Not difficult at all Not difficult at all     Data saved with a previous flowsheet row definition      03/22/2024    3:44 PM 02/27/2023    3:54 PM 11/28/2022    1:14 PM 10/31/2022    2:17 PM  GAD 7 : Generalized Anxiety Score  Nervous, Anxious, on Edge 2 0 0 1  Control/stop worrying 2 0 0 1  Worry too much - different things 2 0 0 1  Trouble relaxing 1 0 0 1  Restless 2 0 0 0  Easily annoyed or irritable 0 0 0 0  Afraid - awful might happen  2 0 0 0  Total GAD 7 Score 11 0 0 4  Anxiety Difficulty Somewhat difficult Not difficult at all Not difficult at all Somewhat difficult    IMMUNIZATIONS: - Tdap: Tetanus vaccination status reviewed: last tetanus booster within 10 years. - HPV: Not applicable - Influenza: Administered today - Pneumovax: Not applicable - Prevnar 20: Administered today - Shingrix (50+): Up to date   Past medical history, surgical history, medications, allergies, family history and social history reviewed with patient today and changes made to appropriate areas of the chart.   Past Medical History:  Diagnosis Date   Anxiety    Depression    Other chronic pain 10/31/2022    Past Surgical History:  Procedure Laterality Date   AUGMENTATION MAMMAPLASTY Bilateral    implants replaced approx 2015    No current outpatient medications on file prior to visit.   No current facility-administered medications on file prior to visit.     Allergies[1]   Social History   Socioeconomic History   Marital status: Divorced    Spouse name: Not on file   Number of children: Not on file   Years of education: Not on file   Highest education level: Not on file  Occupational History   Not on file  Tobacco Use   Smoking status: Former    Current packs/day: 0.00    Types: Cigarettes    Start date: 08/08/1979    Quit date: 08/07/2009    Years since quitting: 14.6    Passive exposure: Past   Smokeless tobacco: Never  Vaping Use   Vaping status: Never Used  Substance and Sexual Activity   Alcohol use: Yes    Alcohol/week: 2.0 - 3.0 standard drinks of alcohol    Types: 2 - 3 Glasses of wine per week    Comment: every day    Drug use: No   Sexual activity: Not Currently  Other Topics Concern   Not on file  Social History Narrative   Not on file   Social Drivers of Health   Tobacco Use: Medium Risk (03/22/2024)   Patient History    Smoking Tobacco Use: Former    Smokeless Tobacco Use: Never    Passive Exposure: Past  Physicist, Medical Strain: Low Risk (03/22/2024)   Overall Financial Resource Strain (CARDIA)    Difficulty of Paying Living Expenses: Not hard at all  Food Insecurity: No Food Insecurity (03/22/2024)   Epic    Worried About Radiation Protection Practitioner of Food in the Last Year: Never true    Ran Out of Food in the Last Year: Never true  Transportation Needs: No Transportation Needs (03/22/2024)   Epic    Lack of Transportation (Medical): No    Lack of Transportation (Non-Medical): No  Physical Activity: Sufficiently Active (03/22/2024)   Exercise Vital Sign    Days of Exercise per Week: 3 days    Minutes of Exercise per Session: 60 min  Stress: No Stress Concern Present (03/22/2024)   [employer] of Occupational Health - Occupational Stress Questionnaire    Feeling of Stress: Not at all  Social Connections: Moderately Isolated (03/22/2024)   Social Connection and Isolation Panel    Frequency of  Communication with Friends and Family: More than three times a week    Frequency of Social Gatherings with Friends and Family: More than three times a week    Attends Religious Services: Never    Database Administrator or Organizations: Yes    Attends [employer]  or Organization Meetings: More than 4 times per year    Marital Status: Never married  Intimate Partner Violence: Not At Risk (03/22/2024)   Epic    Fear of Current or Ex-Partner: No    Emotionally Abused: No    Physically Abused: No    Sexually Abused: No  Depression (PHQ2-9): Low Risk (03/22/2024)   Depression (PHQ2-9)    PHQ-2 Score: 0  Alcohol Screen: Low Risk (03/22/2024)   Alcohol Screen    Last Alcohol Screening Score (AUDIT): 3  Housing: Low Risk (03/22/2024)   Epic    Unable to Pay for Housing in the Last Year: No    Number of Times Moved in the Last Year: 0    Homeless in the Last Year: No  Utilities: Not At Risk (03/22/2024)   Epic    Threatened with loss of utilities: No  Health Literacy: Adequate Health Literacy (03/22/2024)   B1300 Health Literacy    Frequency of need for help with medical instructions: Never   Tobacco Use History[2] Social History   Substance and Sexual Activity  Alcohol Use Yes   Alcohol/week: 2.0 - 3.0 standard drinks of alcohol   Types: 2 - 3 Glasses of wine per week   Comment: every day     Family History  Problem Relation Age of Onset   Hyperlipidemia Mother    Hyperlipidemia Sister    Anxiety disorder Sister      ROS: Denies fever, fatigue, unexplained weight loss/gain, chest pain, SHOB, and palpitations. Denies neurological deficits, gastrointestinal or genitourinary complaints, and skin changes.   Objective:   Today's Vitals   03/22/24 1537  BP: 110/65  Pulse: 67  SpO2: 99%  Weight: 134 lb 11.2 oz (61.1 kg)  Height: 5' 7.5 (1.715 m)    GENERAL APPEARANCE: Well-appearing, in NAD. Well nourished. SKIN: Pink, warm and Russell. Turgor normal. No rash, lesion, ulceration,  or ecchymoses. Hair evenly distributed.  HEENT: HEAD: Normocephalic.  EYES: PERRLA. EOMI. Lids intact w/o defect. Sclera white, Conjunctiva pink w/o exudate.  EARS: External ear w/o redness, swelling, masses or lesions. EAC clear. Left TM intact, translucent w/o bulging, appropriate landmarks visualized. R TM cerumen impaction. Appropriate acuity to conversational tones.  NOSE: Septum midline w/o deformity. Nares patent, mucosa pink and non-inflamed w/o drainage. No sinus tenderness.  THROAT: Uvula midline. Oropharynx clear. Tonsils non-inflamed w/o exudate. Oral mucosa pink and moist.  NECK: Supple, Trachea midline. Full ROM w/o pain or tenderness. No lymphadenopathy. Thyroid  non-tender w/o enlargement or palpable masses. RESPIRATORY: Chest wall symmetrical w/o masses. Respirations even and non-labored. Breath sounds clear to auscultation bilaterally. No wheezes, rales, rhonchi, or crackles. CARDIAC: S1, S2 present, regular rate and rhythm. No gallops, murmurs, rubs, or clicks. PMI w/o lifts, heaves, or thrills. No carotid bruits. Capillary refill <2 seconds. Peripheral pulses 2+ bilaterally. GI: Abdomen soft w/o distention. Normoactive bowel sounds. No palpable masses or tenderness. No guarding or rebound tenderness. Liver and spleen w/o tenderness or enlargement. No CVA tenderness.  GU: Declined MSK: Muscle tone and strength appropriate for age, w/o atrophy or abnormal movement.  EXTREMITIES: Active ROM intact, w/o tenderness, crepitus, or contracture. No obvious joint deformities or effusions. No clubbing, edema, or cyanosis.  NEUROLOGIC: CN's II-XII intact. Motor strength symmetrical with no obvious weakness. No sensory deficits. DTR's 2+ symmetric bilaterally. Steady, even gait.  PSYCH/MENTAL STATUS: Alert, oriented x 3. Cooperative, appropriate mood and affect.    Results for orders placed or performed in visit on 02/24/23  Lipid panel  Collection Time: 02/24/23  9:40 AM  Result Value Ref  Range   Cholesterol, Total 199 100 - 199 mg/dL   Triglycerides 847 (H) 0 - 149 mg/dL   HDL 61 >60 mg/dL   VLDL Cholesterol Cal 26 5 - 40 mg/dL   LDL Chol Calc (NIH) 887 (H) 0 - 99 mg/dL   Chol/HDL Ratio 3.3 0.0 - 4.4 ratio  Comprehensive metabolic panel   Collection Time: 02/24/23  9:40 AM  Result Value Ref Range   Glucose 91 70 - 99 mg/dL   BUN 9 6 - 24 mg/dL   Creatinine, Ser 9.29 0.57 - 1.00 mg/dL   eGFR 899 >40 fO/fpw/8.26   BUN/Creatinine Ratio 13 9 - 23   Sodium 141 134 - 144 mmol/L   Potassium 4.2 3.5 - 5.2 mmol/L   Chloride 102 96 - 106 mmol/L   CO2 21 20 - 29 mmol/L   Calcium  9.9 8.7 - 10.2 mg/dL   Total Protein 7.1 6.0 - 8.5 g/dL   Albumin 4.9 3.8 - 4.9 g/dL   Globulin, Total 2.2 1.5 - 4.5 g/dL   Bilirubin Total 0.3 0.0 - 1.2 mg/dL   Alkaline Phosphatase 55 44 - 121 IU/L   AST 22 0 - 40 IU/L   ALT 18 0 - 32 IU/L    Assessment & Plan:  1. Annual physical exam (Primary) Discussed preventative screenings, vaccines, lab work, and healthy lifestyle with patient.  - CBC with Differential/Platelet - Comprehensive metabolic panel with GFR - Hemoglobin A1c - TSH - Lipid panel  2. Familial hyperlipidemia Labs rechecked at today's visit. Currently stable on regimen. Medication refilled at today's visit. - atorvastatin  (LIPITOR) 20 MG tablet; Take 1 tablet (20 mg total) by mouth daily.  Dispense: 90 tablet; Refill: 3  3. Primary hypertension Controlled on current regimen. Medication refilled. - losartan  (COZAAR ) 25 MG tablet; Take 1 tablet (25 mg total) by mouth daily.  Dispense: 90 tablet; Refill: 2  4. Immunization due Given at today's visit. Discussed risks and benefits.  - Pneumococcal conjugate vaccine 20-valent (Prevnar 20)  5. Impacted cerumen of right ear States this is chronic and would like referral to ENT for further management. - Ambulatory referral to ENT  6. Encounter for immunization Given today. Discussed risks and benefits.  - Flu vaccine  trivalent PF, 6mos and older(Flulaval,Afluria,Fluarix,Fluzone)    PATIENT COUNSELING:  - Encouraged a healthy well-balanced diet. Patient may adjust caloric intake to maintain or achieve ideal body weight. May reduce intake of dietary saturated fat and total fat and have adequate dietary potassium and calcium  preferably from fresh fruits, vegetables, and low-fat dairy products.   - Advised to avoid cigarette smoking. - Discussed with the patient that most people either abstain from alcohol or drink within safe limits (<=14/week and <=4 drinks/occasion for males, <=7/weeks and <= 3 drinks/occasion for females) and that the risk for alcohol disorders and other health effects rises proportionally with the number of drinks per week and how often a drinker exceeds daily limits. - Discussed cessation/primary prevention of drug use and availability of treatment for abuse.  - Discussed sexually transmitted diseases, avoidance of unintended pregnancy and contraceptive alternatives. - Stressed the importance of regular exercise - Injury prevention: Discussed safety belts, safety helmets, smoke detector, smoking near bedding or upholstery.  - Dental health: Discussed importance of regular tooth brushing, flossing, and dental visits.   NEXT PREVENTATIVE PHYSICAL DUE IN 1 YEAR.  Return in about 6 months (around 09/20/2024) for follow up with  Alexis.  Patient to reach out to office if new, worrisome, or unresolved symptoms arise or if no improvement in patient's condition. Patient verbalized understanding and is agreeable to treatment plan. All questions answered to patient's satisfaction.    Lauraine Almarie Angus DNP, FNP-C     [1] No Known Allergies [2]  Social History Tobacco Use  Smoking Status Former   Current packs/day: 0.00   Types: Cigarettes   Start date: 08/08/1979   Quit date: 08/07/2009   Years since quitting: 14.6   Passive exposure: Past  Smokeless Tobacco Never   "

## 2024-03-23 ENCOUNTER — Ambulatory Visit (HOSPITAL_BASED_OUTPATIENT_CLINIC_OR_DEPARTMENT_OTHER): Payer: Self-pay

## 2024-03-23 LAB — CBC WITH DIFFERENTIAL/PLATELET
Basophils Absolute: 0 x10E3/uL (ref 0.0–0.2)
Basos: 1 %
EOS (ABSOLUTE): 0.1 x10E3/uL (ref 0.0–0.4)
Eos: 1 %
Hematocrit: 45 % (ref 34.0–46.6)
Hemoglobin: 15.1 g/dL (ref 11.1–15.9)
Immature Grans (Abs): 0 x10E3/uL (ref 0.0–0.1)
Immature Granulocytes: 0 %
Lymphocytes Absolute: 2.2 x10E3/uL (ref 0.7–3.1)
Lymphs: 31 %
MCH: 35 pg — ABNORMAL HIGH (ref 26.6–33.0)
MCHC: 33.6 g/dL (ref 31.5–35.7)
MCV: 104 fL — ABNORMAL HIGH (ref 79–97)
Monocytes Absolute: 0.6 x10E3/uL (ref 0.1–0.9)
Monocytes: 8 %
Neutrophils Absolute: 4.2 x10E3/uL (ref 1.4–7.0)
Neutrophils: 59 %
Platelets: 345 x10E3/uL (ref 150–450)
RBC: 4.32 x10E6/uL (ref 3.77–5.28)
RDW: 11.7 % (ref 11.7–15.4)
WBC: 7.2 x10E3/uL (ref 3.4–10.8)

## 2024-03-23 LAB — COMPREHENSIVE METABOLIC PANEL WITH GFR
ALT: 17 IU/L (ref 0–32)
AST: 28 IU/L (ref 0–40)
Albumin: 5.5 g/dL — ABNORMAL HIGH (ref 3.8–4.9)
Alkaline Phosphatase: 52 IU/L (ref 49–135)
BUN/Creatinine Ratio: 21 (ref 12–28)
BUN: 13 mg/dL (ref 8–27)
Bilirubin Total: 0.6 mg/dL (ref 0.0–1.2)
CO2: 22 mmol/L (ref 20–29)
Calcium: 10.2 mg/dL (ref 8.7–10.3)
Chloride: 97 mmol/L (ref 96–106)
Creatinine, Ser: 0.62 mg/dL (ref 0.57–1.00)
Globulin, Total: 2.1 g/dL (ref 1.5–4.5)
Glucose: 124 mg/dL — ABNORMAL HIGH (ref 70–99)
Potassium: 4.8 mmol/L (ref 3.5–5.2)
Sodium: 139 mmol/L (ref 134–144)
Total Protein: 7.6 g/dL (ref 6.0–8.5)
eGFR: 102 mL/min/1.73

## 2024-03-23 LAB — HEMOGLOBIN A1C
Est. average glucose Bld gHb Est-mCnc: 97 mg/dL
Hgb A1c MFr Bld: 5 % (ref 4.8–5.6)

## 2024-03-23 LAB — LIPID PANEL
Chol/HDL Ratio: 2.6 ratio (ref 0.0–4.4)
Cholesterol, Total: 272 mg/dL — ABNORMAL HIGH (ref 100–199)
HDL: 103 mg/dL
LDL Chol Calc (NIH): 146 mg/dL — ABNORMAL HIGH (ref 0–99)
Triglycerides: 135 mg/dL (ref 0–149)
VLDL Cholesterol Cal: 23 mg/dL (ref 5–40)

## 2024-03-23 LAB — TSH: TSH: 3.03 u[IU]/mL (ref 0.450–4.500)

## 2024-03-23 NOTE — Progress Notes (Signed)
 Hi Lori Russell, In looking over your lab results: your blood work, electrolytes, kidney function, thyroid , liver function, and A1C are all normal. Your cholesterol levels were higher than last year. Were you fasting for your lab work yesterday?

## 2024-04-12 ENCOUNTER — Ambulatory Visit (HOSPITAL_BASED_OUTPATIENT_CLINIC_OR_DEPARTMENT_OTHER): Payer: Self-pay | Admitting: Family Medicine

## 2024-04-12 NOTE — Progress Notes (Signed)
   Your mammogram results show no evidence of breast cancer. We will plan to repeat this in 1 year for routine screening.  If you should have concerns or changes in your breasts within the next year, please let us know.   Jerre Simon, FNP-C

## 2024-09-20 ENCOUNTER — Ambulatory Visit (HOSPITAL_BASED_OUTPATIENT_CLINIC_OR_DEPARTMENT_OTHER): Admitting: Family Medicine
